# Patient Record
Sex: Female | Born: 1975 | Race: White | Hispanic: No | Marital: Single | Smoking: Current every day smoker
Health system: Southern US, Community
[De-identification: ages and names within clinical notes are randomized; demographics above are authoritative.]

## PROBLEM LIST (undated history)

## (undated) DIAGNOSIS — G932 Benign intracranial hypertension: Secondary | ICD-10-CM

## (undated) DIAGNOSIS — E079 Disorder of thyroid, unspecified: Secondary | ICD-10-CM

## (undated) DIAGNOSIS — F419 Anxiety disorder, unspecified: Secondary | ICD-10-CM

## (undated) HISTORY — PX: WISDOM TOOTH EXTRACTION: SHX21

## (undated) HISTORY — PX: VENTRICULAR ATRIAL SHUNT: SHX2657

## (undated) HISTORY — PX: CHOLECYSTECTOMY: SHX55

---

## 2005-10-02 ENCOUNTER — Emergency Department: Payer: Self-pay | Admitting: Emergency Medicine

## 2006-06-15 ENCOUNTER — Emergency Department: Payer: Self-pay | Admitting: Emergency Medicine

## 2006-08-12 ENCOUNTER — Emergency Department: Payer: Self-pay | Admitting: Emergency Medicine

## 2006-12-17 IMAGING — CR DG LUMBAR SPINE 2-3V
1 series · 3 of 3 positions shown · non-contrast
Comparison: none

REASON FOR EXAM: Mid back pain
COMMENTS:

[Series 1: view not recorded · 0.17mm/px · 3 of 3 slices shown]
[im 1/3]
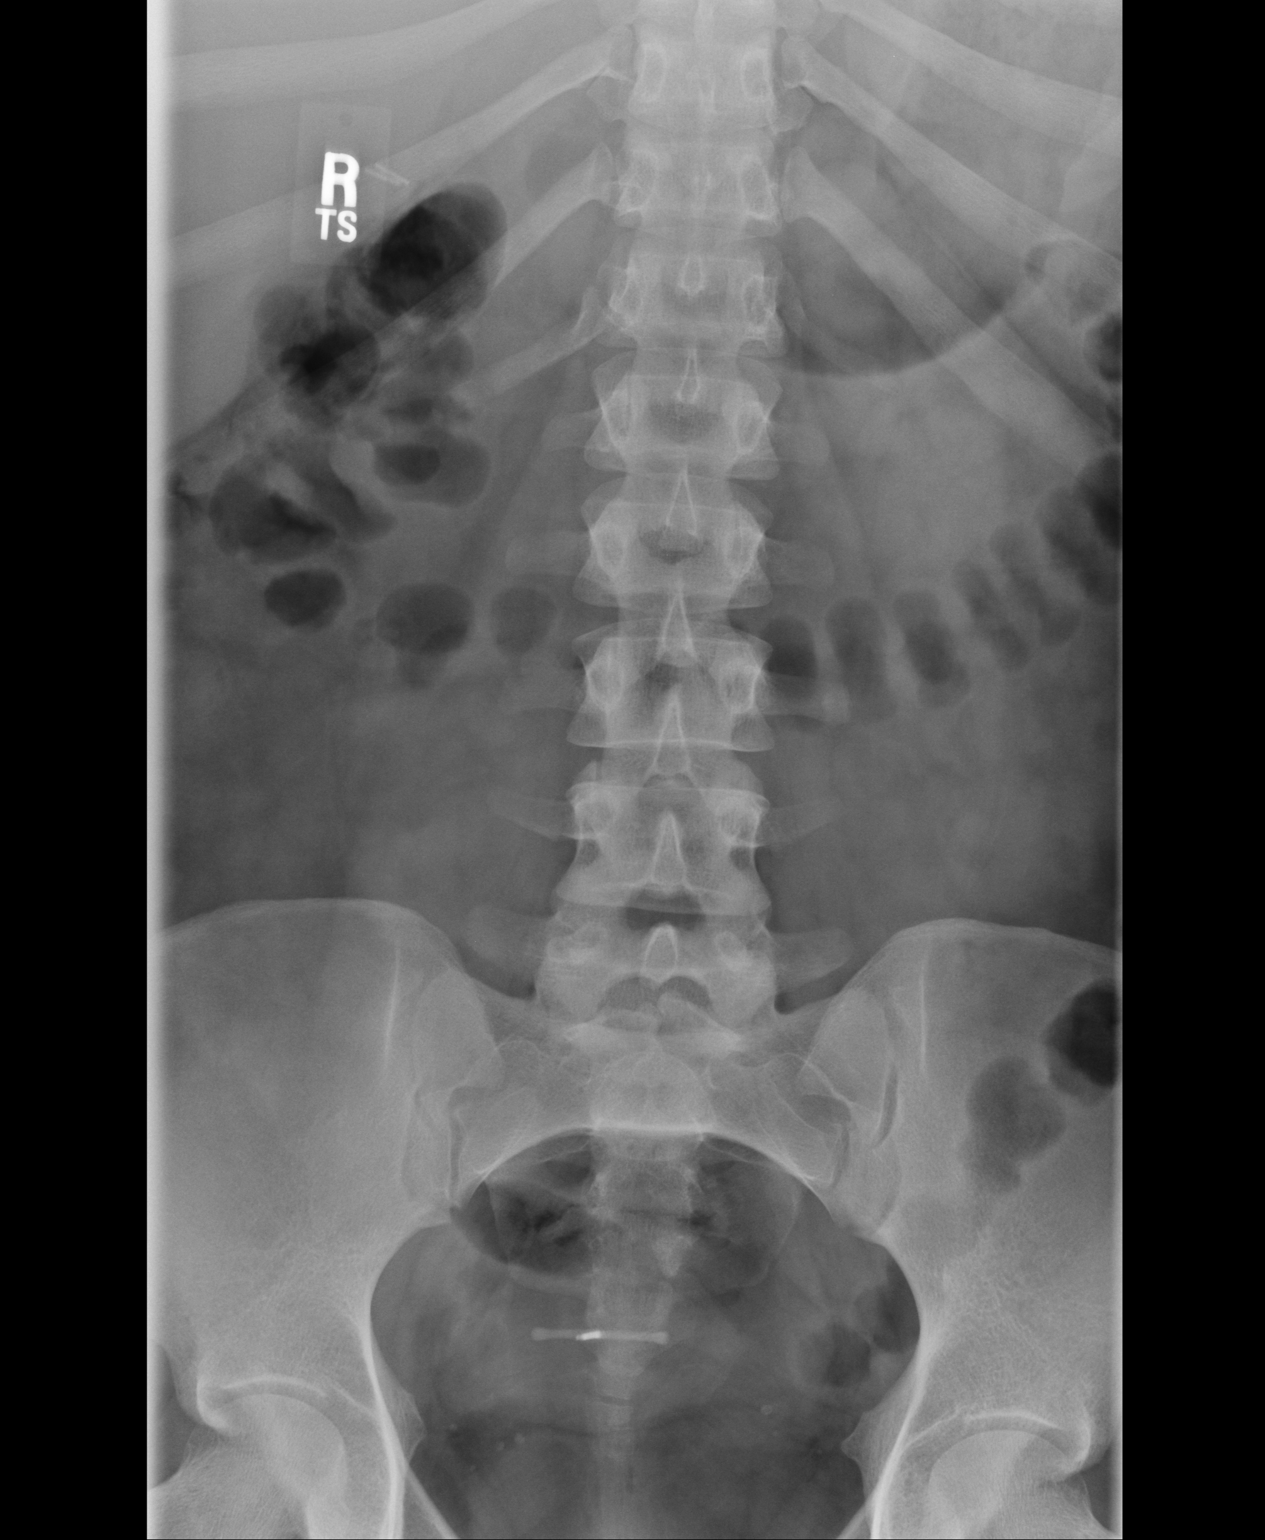
[im 2/3]
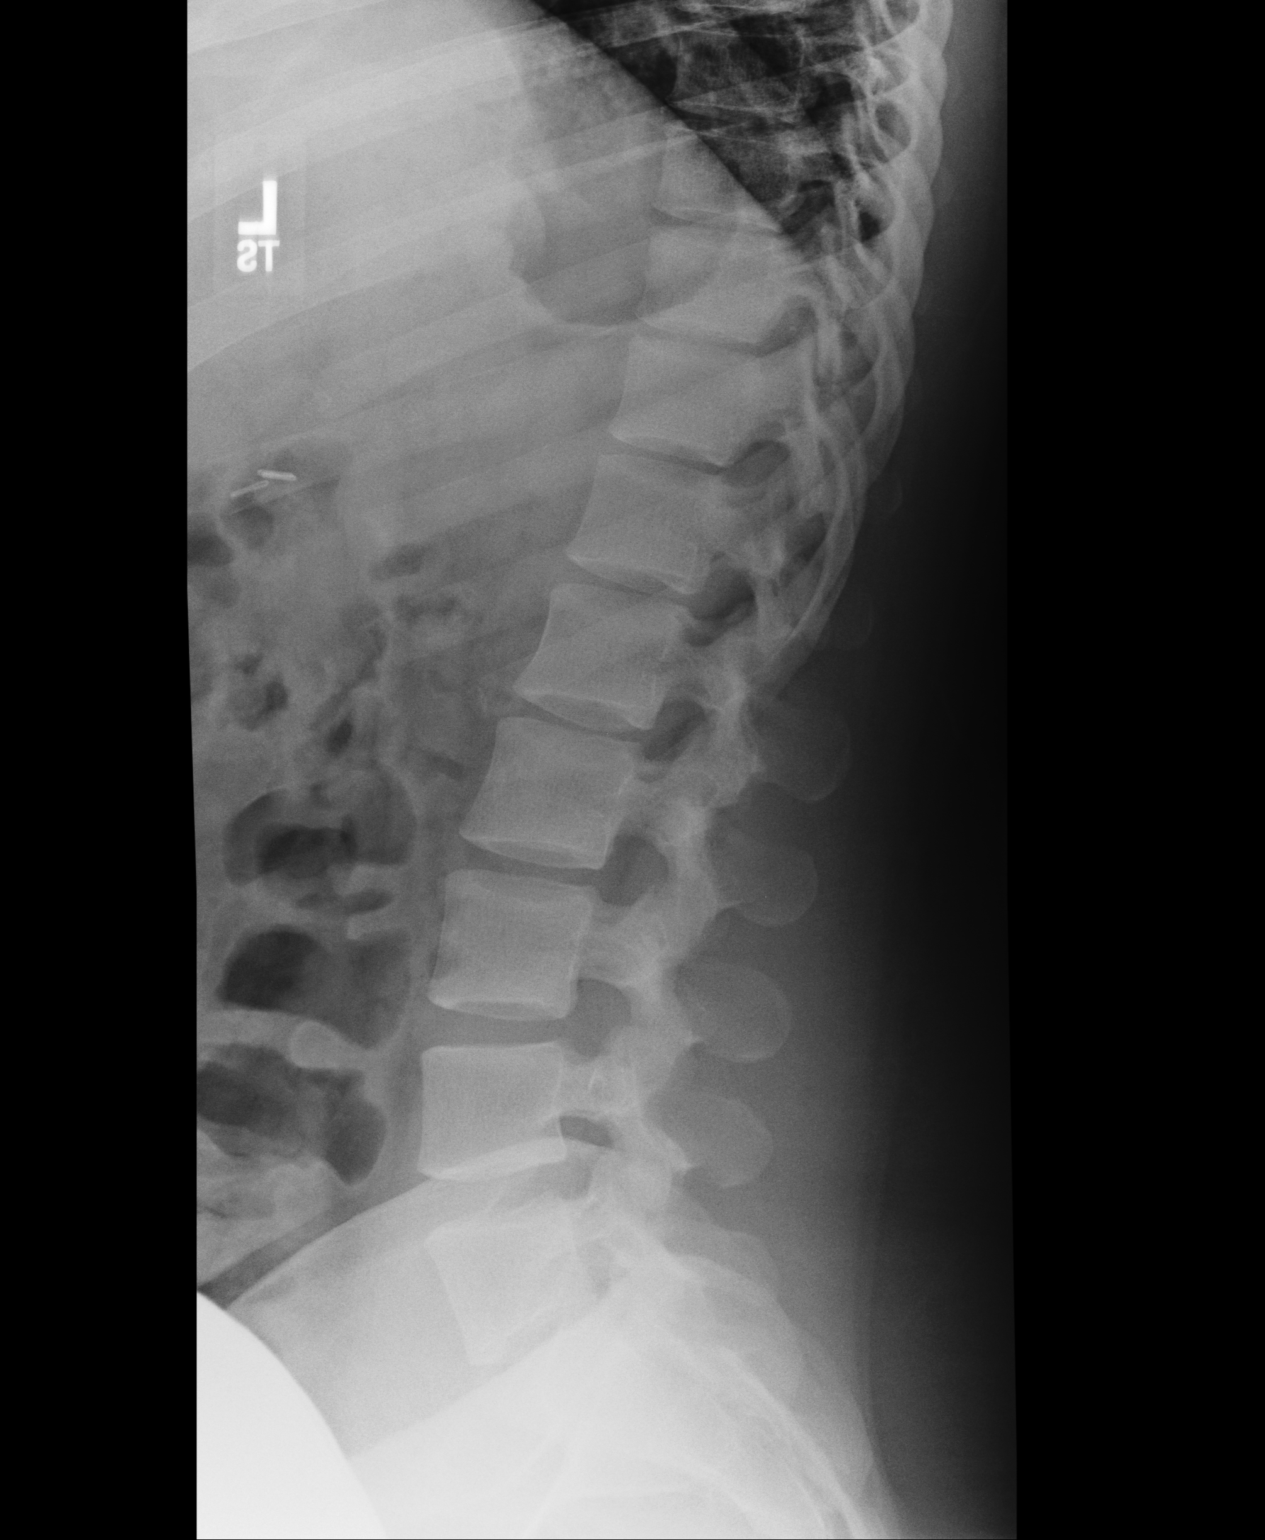
[im 3/3]
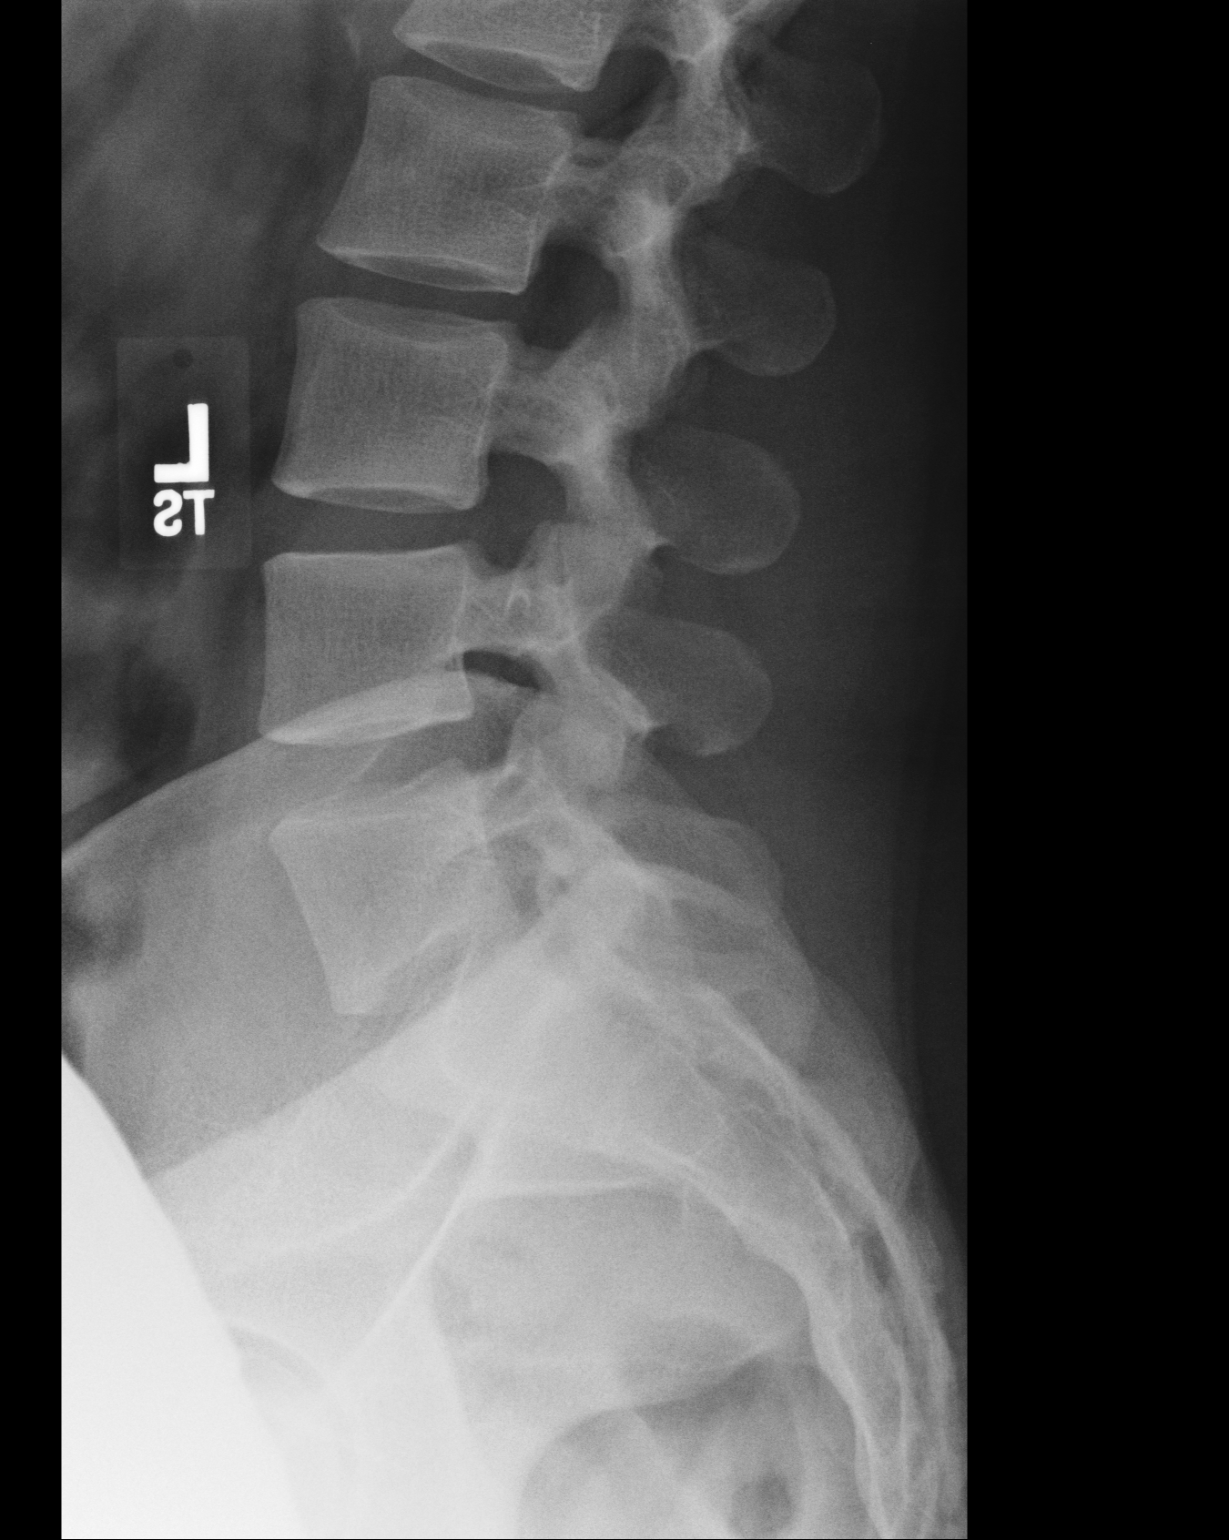

[3 of 3 positions shown; findings below may reference images not displayed]

PROCEDURE:     DXR - DXR LUMBAR SPINE AP AND LATERAL  - June 15, 2006  [DATE]

RESULT:       AP and lateral views of the lumbar spine show the vertebral
body heights and intervertebral disc spaces to be well maintained.
Vertebral body alignment is normal.  The pedicles are bilaterally intact.
Incidental note is made of a IUD in the pelvic area.
IMPRESSION: No acute changes are identified.

## 2007-08-22 ENCOUNTER — Emergency Department: Payer: Self-pay | Admitting: Emergency Medicine

## 2007-09-05 ENCOUNTER — Emergency Department: Payer: Self-pay | Admitting: Emergency Medicine

## 2007-11-28 ENCOUNTER — Emergency Department: Payer: Self-pay | Admitting: Emergency Medicine

## 2008-03-09 ENCOUNTER — Other Ambulatory Visit: Payer: Self-pay

## 2008-03-09 ENCOUNTER — Emergency Department: Payer: Self-pay | Admitting: Emergency Medicine

## 2008-05-31 IMAGING — CR DG CHEST 2V
1 series · 2 of 2 positions shown · non-contrast
Comparison: none

REASON FOR EXAM: Pain, productive cough
COMMENTS:

[Series 1: view not recorded · 0.17mm/px · 2 of 2 slices shown]
[im 1/2]
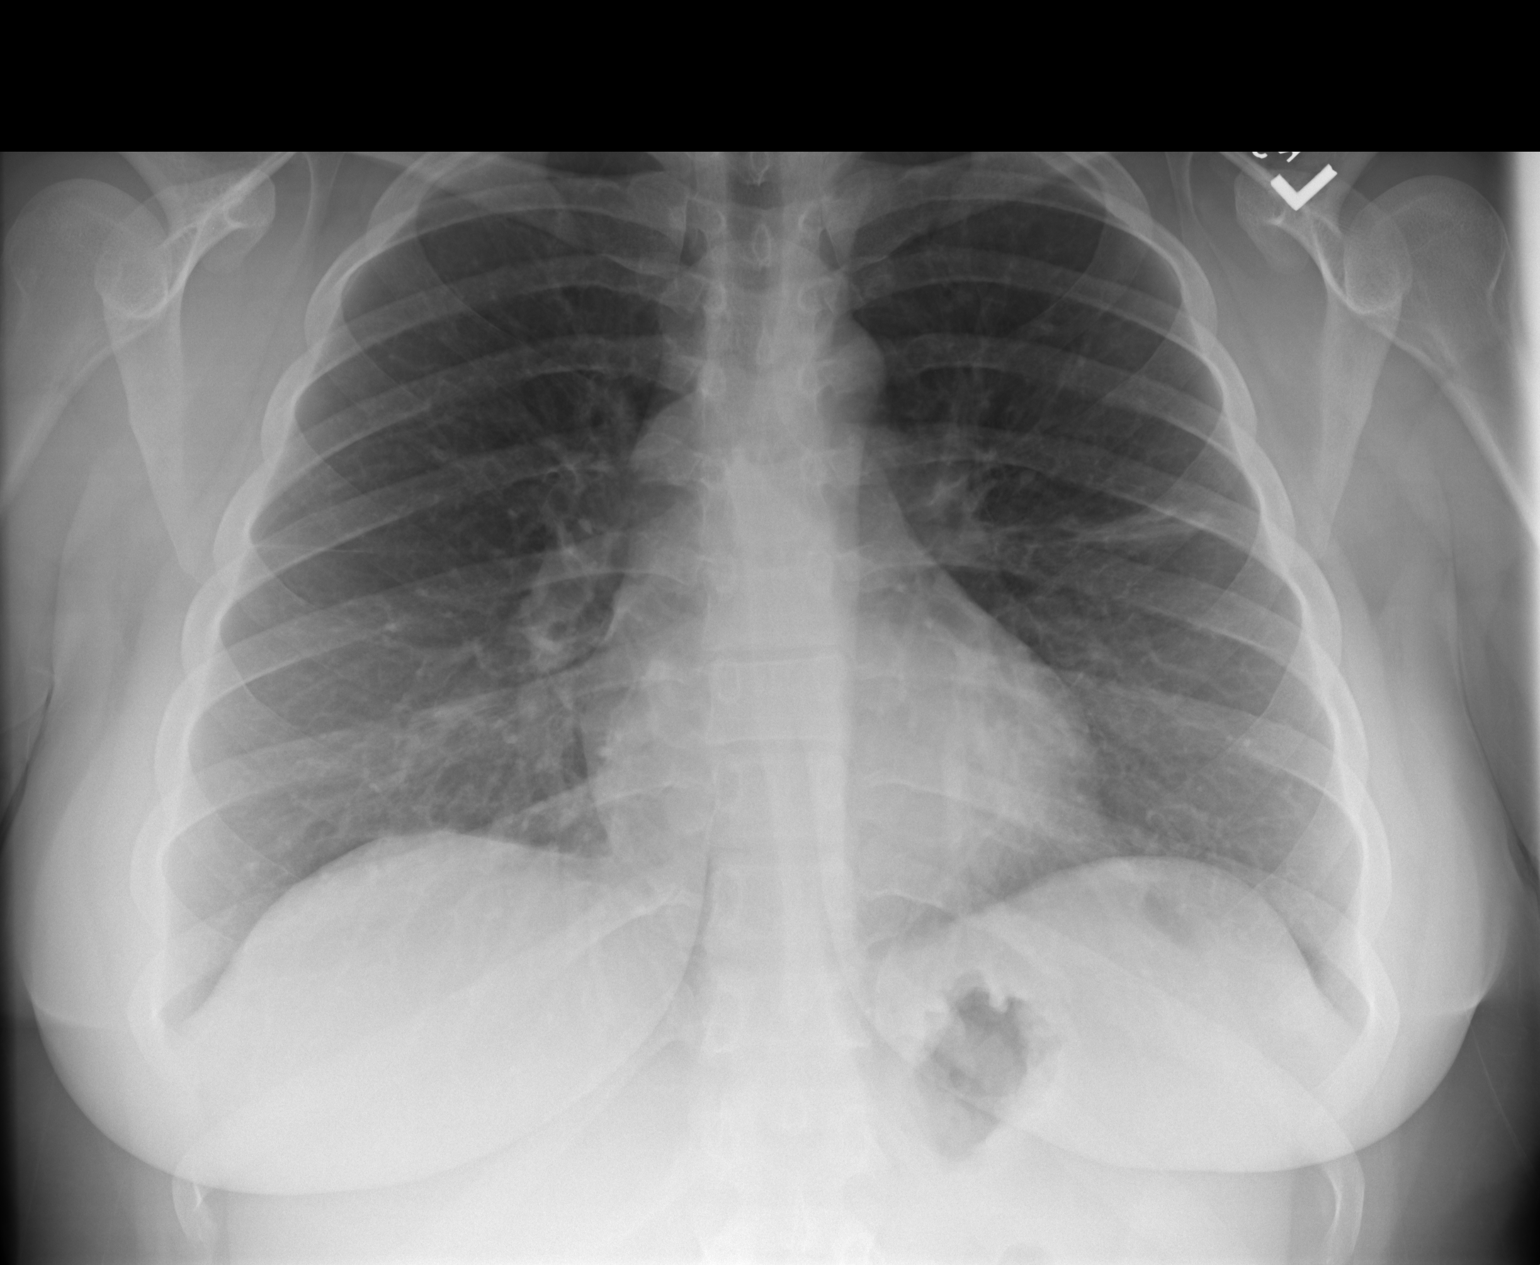
[im 2/2]
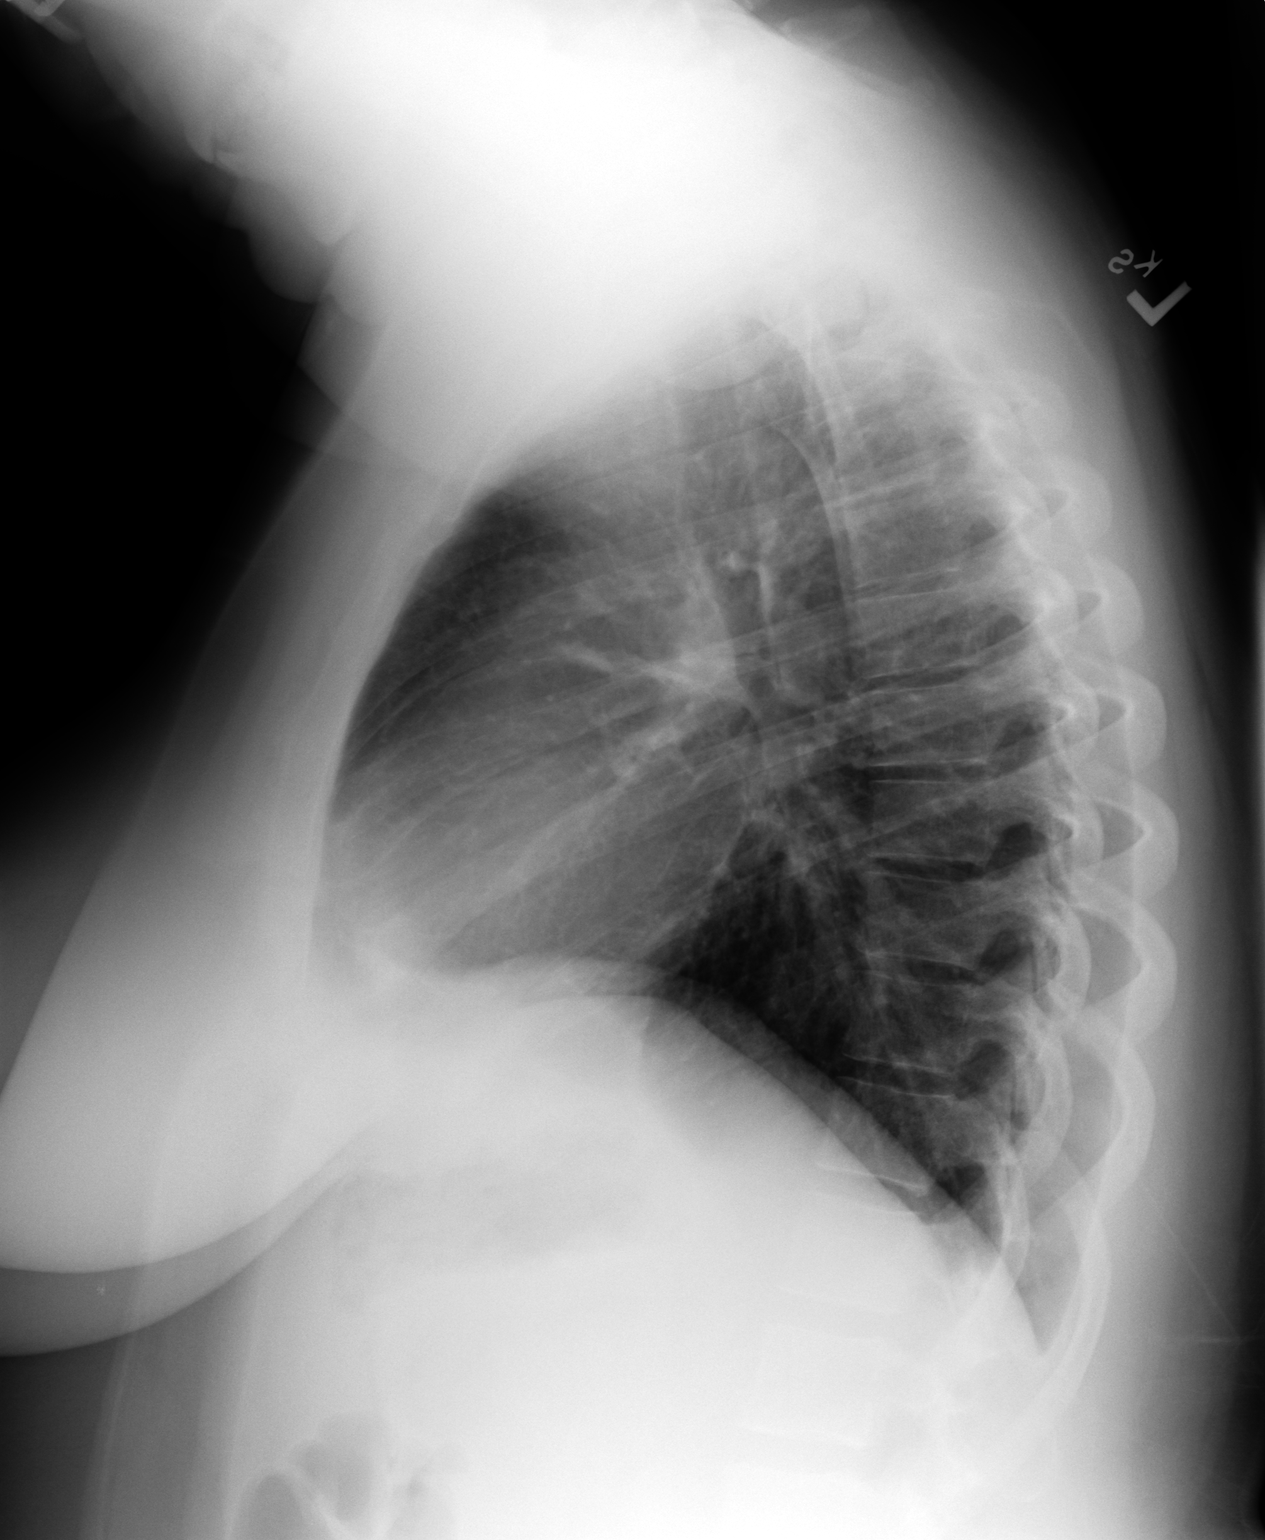

[2 of 2 positions shown; findings below may reference images not displayed]

PROCEDURE:     DXR - DXR CHEST PA (OR AP) AND LATERAL  - November 28, 2007 [DATE]

RESULT:     There is increased density in the LEFT mid lung suggestive of
plate-like atelectasis or early infiltrate. The lungs are otherwise clear.
The heart and pulmonary vessels are normal. The bony and mediastinal
structures are unremarkable.
IMPRESSION: LEFT mid lung atelectasis or developing infiltrate.

## 2008-08-13 ENCOUNTER — Emergency Department: Payer: Self-pay | Admitting: Emergency Medicine

## 2008-09-10 IMAGING — CR DG CHEST 2V
1 series · 2 of 2 positions shown · non-contrast
Comparison: none

REASON FOR EXAM: Chest pain
COMMENTS:

PROCEDURE:     DXR - DXR CHEST PA (OR AP) AND LATERAL  - March 09, 2008  [DATE]
RESULT:     Comparison is made to the prior exam of 11/28/2007.
The lung fields are clear. The heart, mediastinal and osseous structures
show no significant abnormalities.

[Series 1: view not recorded · 0.17mm/px · 2 of 2 slices shown]
[im 1/2]
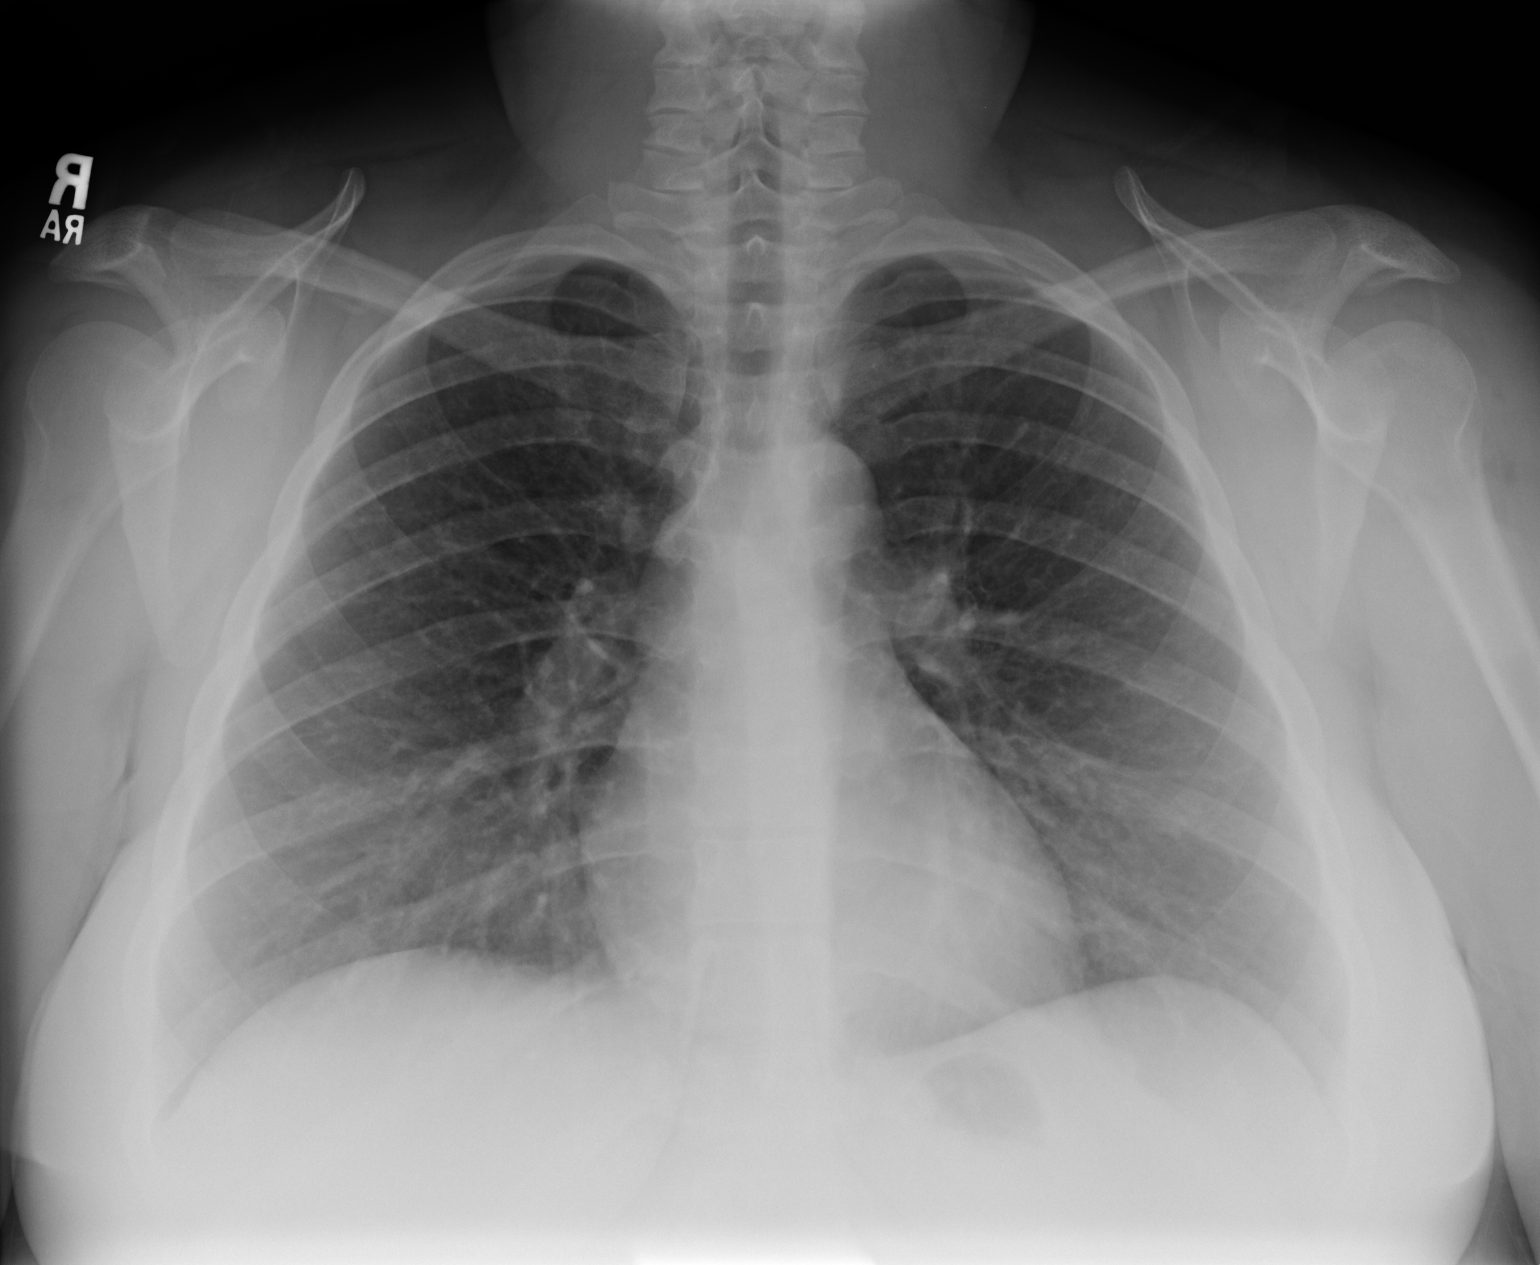
[im 2/2]
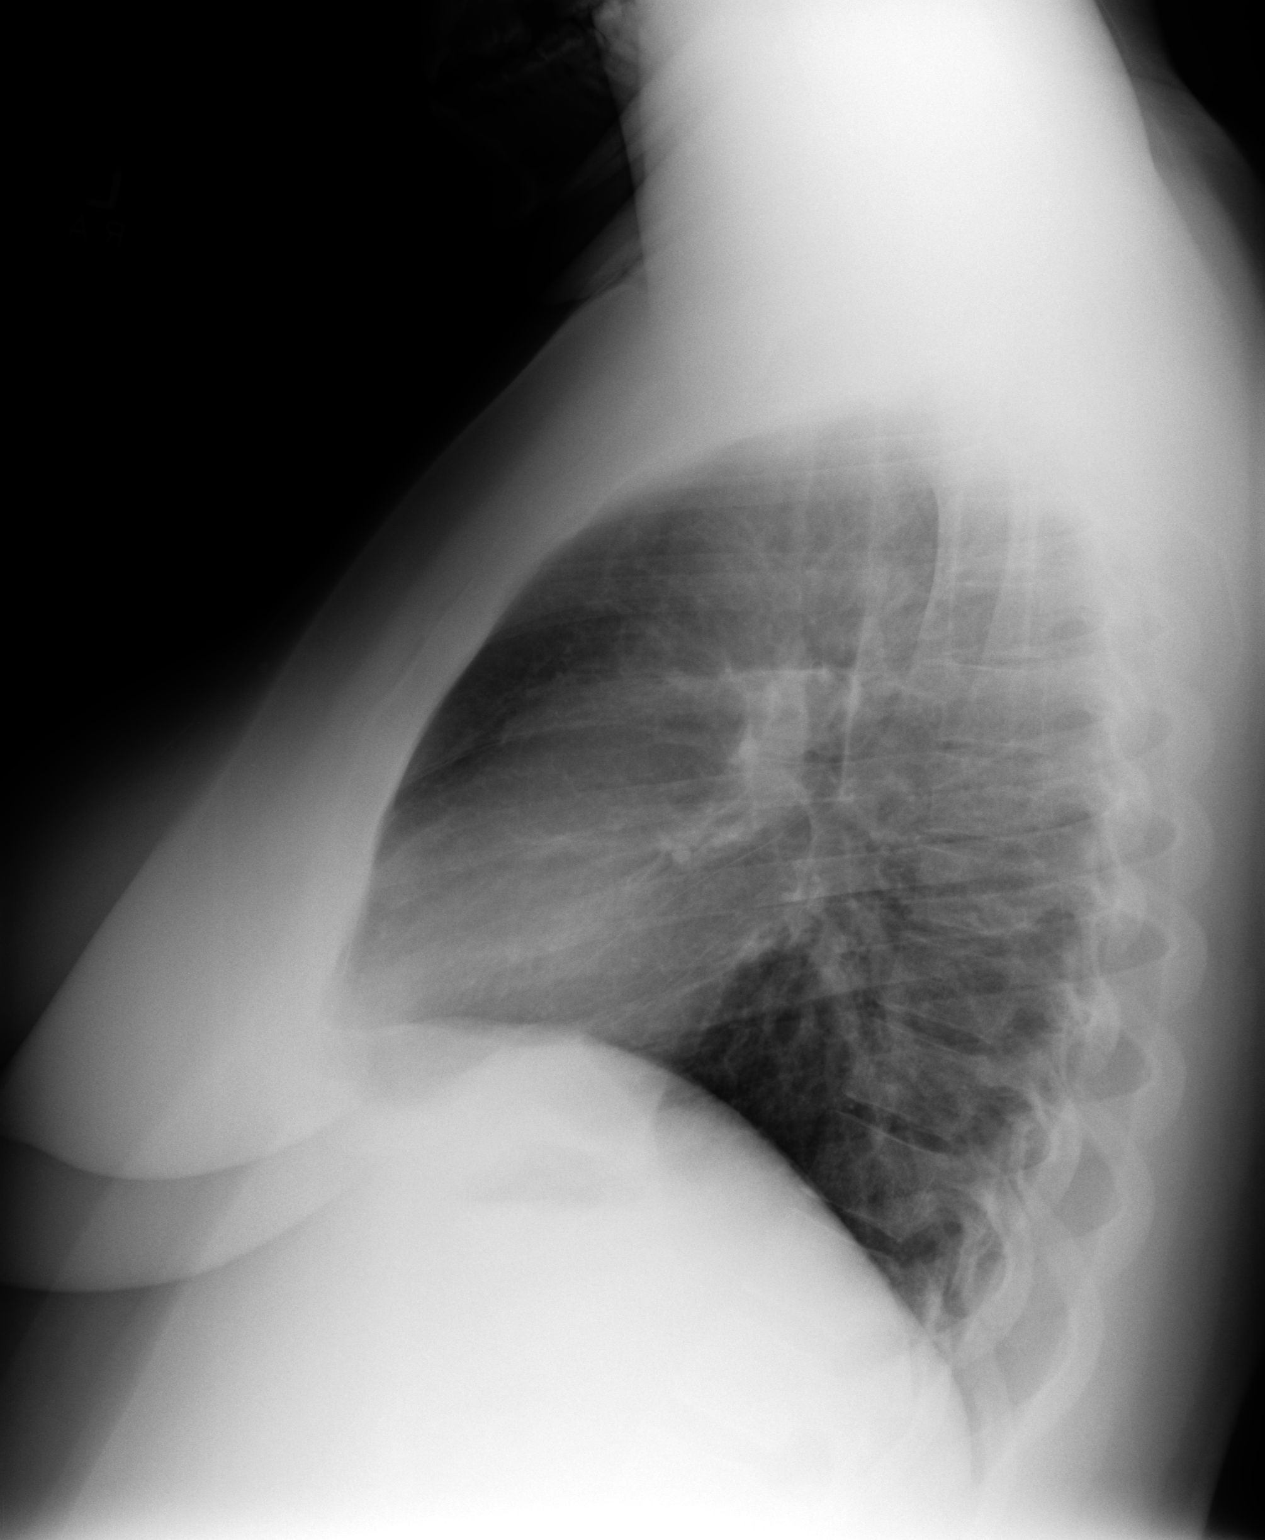

[2 of 2 positions shown; findings below may reference images not displayed]

IMPRESSION: No acute changes are identified.

## 2008-10-26 ENCOUNTER — Ambulatory Visit: Payer: Self-pay | Admitting: Internal Medicine

## 2009-01-24 ENCOUNTER — Emergency Department: Payer: Self-pay

## 2009-02-14 IMAGING — CR DG FOOT COMPLETE 3+V*L*
1 series · 3 of 3 positions shown · non-contrast
Comparison: none

REASON FOR EXAM: pain
COMMENTS:

PROCEDURE:     DXR - DXR FOOT LT COMP W/OBLIQUES  - August 13, 2008 [DATE]
RESULT:     No acute bony or joint abnormalities are identified. There is no
evidence of fracture.

[Series 1: view not recorded · 0.17mm/px · 3 of 3 slices shown]
[im 1/3]
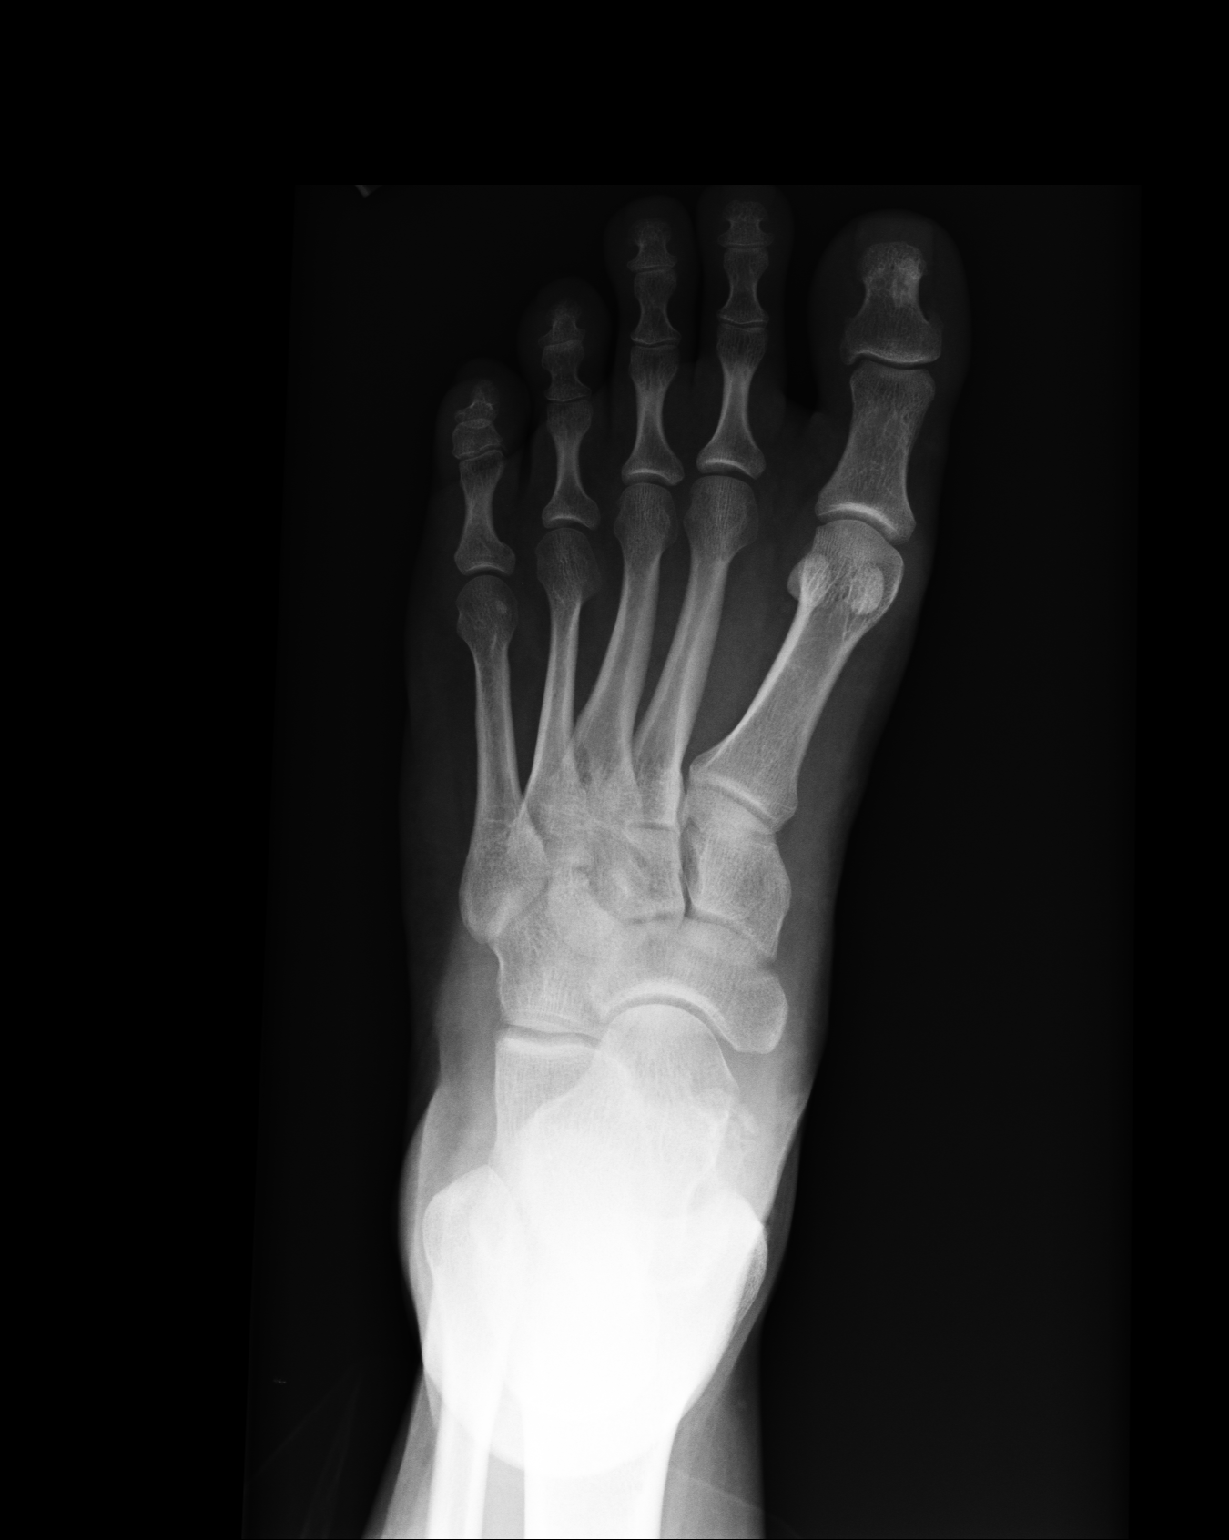
[im 2/3]
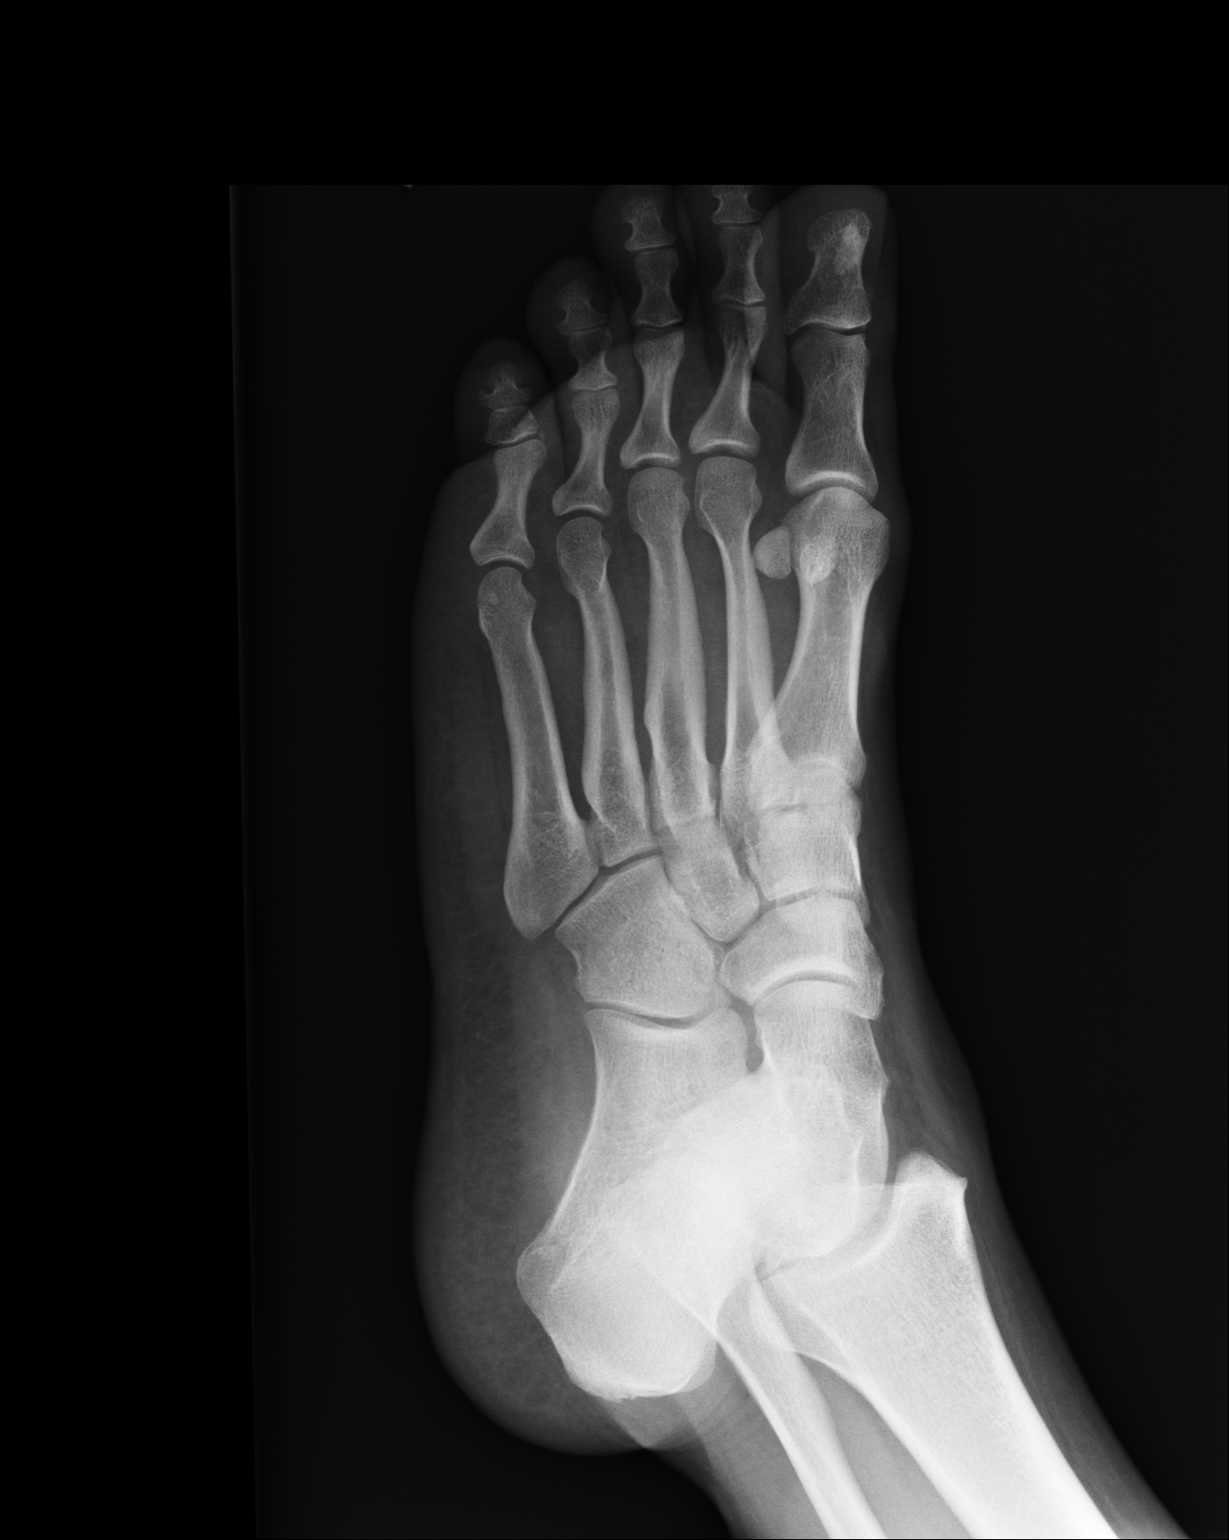
[im 3/3]
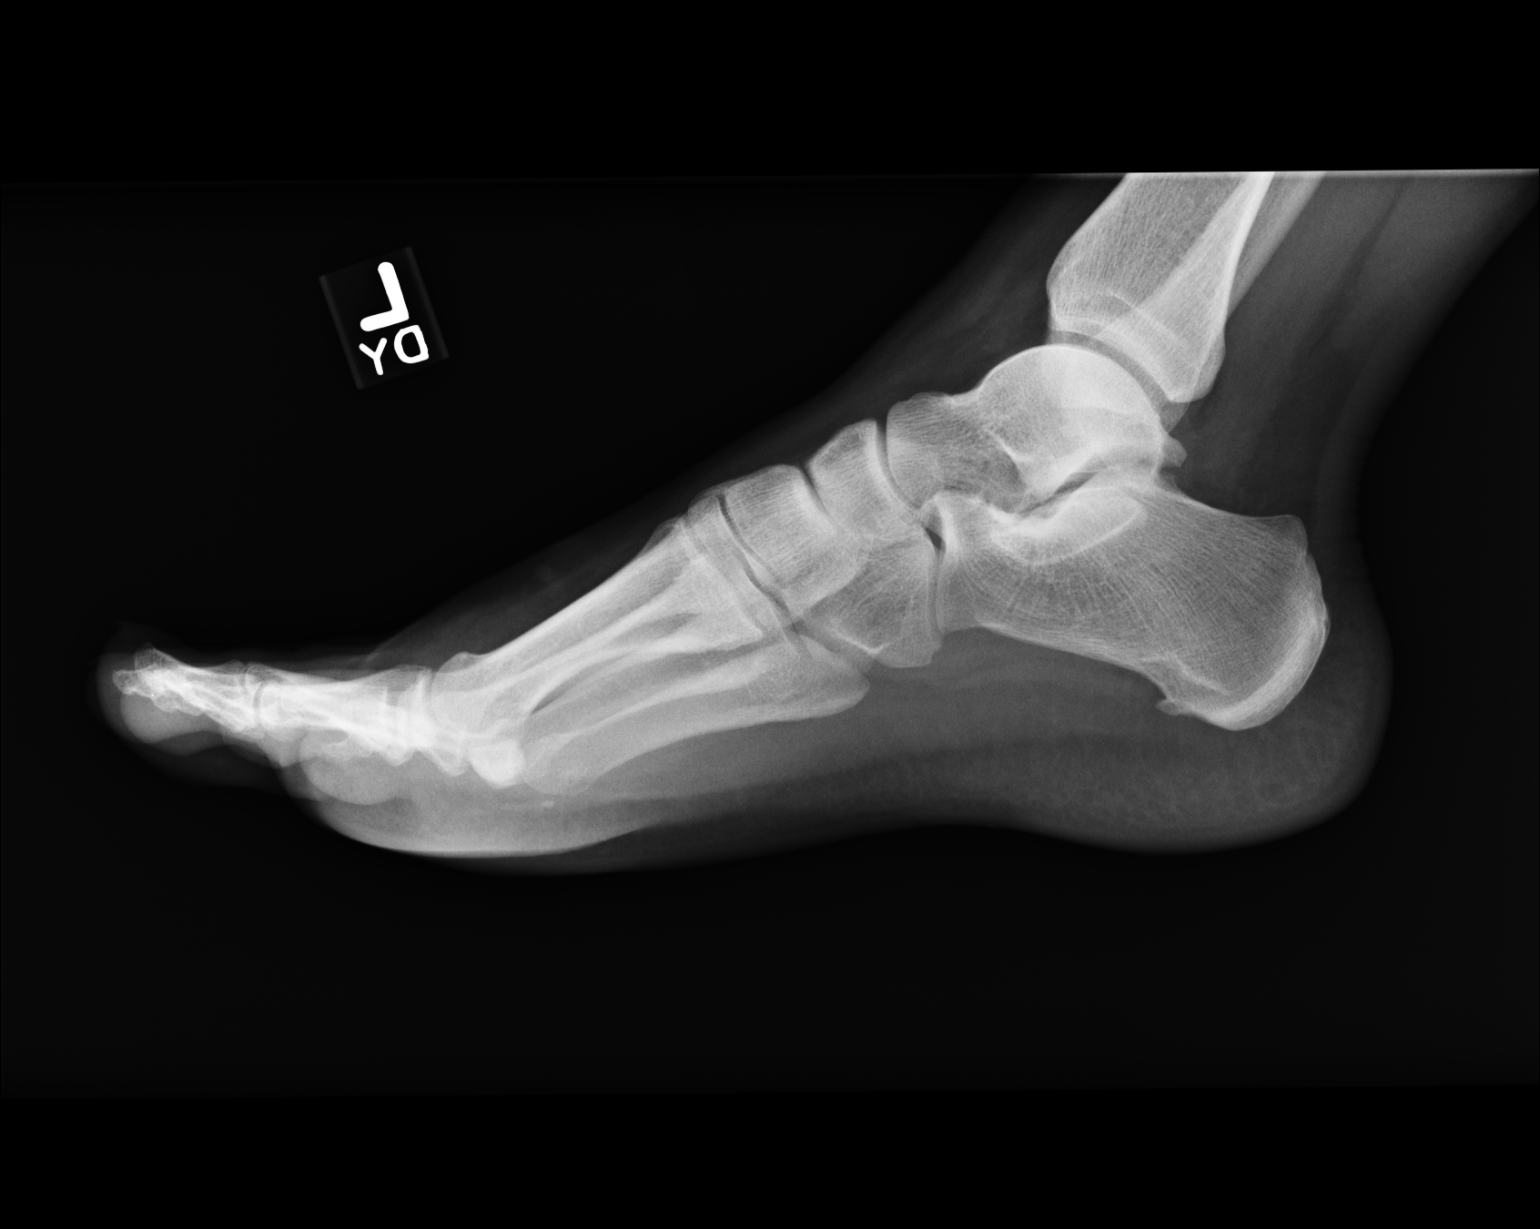

[3 of 3 positions shown; findings below may reference images not displayed]

IMPRESSION: 1. No acute bony or joint abnormalities are identified.

## 2011-08-24 ENCOUNTER — Emergency Department: Payer: Self-pay | Admitting: *Deleted

## 2011-08-27 ENCOUNTER — Inpatient Hospital Stay: Payer: Self-pay | Admitting: Psychiatry

## 2011-11-09 ENCOUNTER — Emergency Department: Payer: Self-pay | Admitting: *Deleted

## 2012-06-28 ENCOUNTER — Inpatient Hospital Stay: Payer: Self-pay | Admitting: Psychiatry

## 2012-06-28 LAB — DRUG SCREEN, URINE
Amphetamines, Ur Screen: NEGATIVE (ref ?–1000)
Benzodiazepine, Ur Scrn: NEGATIVE (ref ?–200)
Cannabinoid 50 Ng, Ur ~~LOC~~: POSITIVE (ref ?–50)
Cocaine Metabolite,Ur ~~LOC~~: POSITIVE (ref ?–300)
MDMA (Ecstasy)Ur Screen: NEGATIVE (ref ?–500)
Opiate, Ur Screen: NEGATIVE (ref ?–300)
Phencyclidine (PCP) Ur S: NEGATIVE (ref ?–25)

## 2012-06-28 LAB — URINALYSIS, COMPLETE
Bilirubin,UR: NEGATIVE
Glucose,UR: NEGATIVE mg/dL (ref 0–75)
Ph: 5 (ref 4.5–8.0)
Specific Gravity: 1.023 (ref 1.003–1.030)
Squamous Epithelial: 12

## 2012-06-28 LAB — CBC
HCT: 40 % (ref 35.0–47.0)
HGB: 13.9 g/dL (ref 12.0–16.0)
MCH: 32.3 pg (ref 26.0–34.0)
MCHC: 34.9 g/dL (ref 32.0–36.0)
RDW: 12.4 % (ref 11.5–14.5)

## 2012-06-28 LAB — COMPREHENSIVE METABOLIC PANEL
Albumin: 3.7 g/dL (ref 3.4–5.0)
Alkaline Phosphatase: 87 U/L (ref 50–136)
BUN: 5 mg/dL — ABNORMAL LOW (ref 7–18)
Bilirubin,Total: 0.8 mg/dL (ref 0.2–1.0)
Chloride: 110 mmol/L — ABNORMAL HIGH (ref 98–107)
Creatinine: 1.08 mg/dL (ref 0.60–1.30)
EGFR (African American): >60
Glucose: 93 mg/dL (ref 65–99)
Osmolality: 282 (ref 275–301)
Potassium: 3.3 mmol/L — ABNORMAL LOW (ref 3.5–5.1)
SGOT(AST): 20 U/L (ref 15–37)
Sodium: 143 mmol/L (ref 136–145)

## 2012-06-28 LAB — ETHANOL: Ethanol %: <0.003 % (ref 0.000–0.080)

## 2012-06-28 LAB — PREGNANCY, URINE: Pregnancy Test, Urine: NEGATIVE m[IU]/mL

## 2012-07-01 LAB — HEPATIC FUNCTION PANEL A (ARMC)
Albumin: 3.7 g/dL (ref 3.4–5.0)
Alkaline Phosphatase: 82 U/L (ref 50–136)
Bilirubin, Direct: 0.2 mg/dL (ref 0.00–0.20)
Bilirubin,Total: 0.7 mg/dL (ref 0.2–1.0)
Total Protein: 7 g/dL (ref 6.4–8.2)

## 2012-07-01 LAB — VALPROIC ACID LEVEL: Valproic Acid: 68 ug/mL

## 2012-12-30 IMAGING — CR RIGHT HAND - COMPLETE 3+ VIEW
1 series · 3 of 3 positions shown · non-contrast
Comparison: none

REASON FOR EXAM: bruising and pain eval fx
COMMENTS:

PROCEDURE:     DXR - DXR HAND RT COMPLETE W/OBLIQUES  - June 28, 2012  [DATE]
RESULT:     Right hand images show a tiny avulsion from the tip of the ulnar
styloid process. The bony structures otherwise appear intact. No dislocation
is evident.

[Series 1: x hand pa right · 0.14mm/px · 3 of 3 slices shown]
[im 1/3]
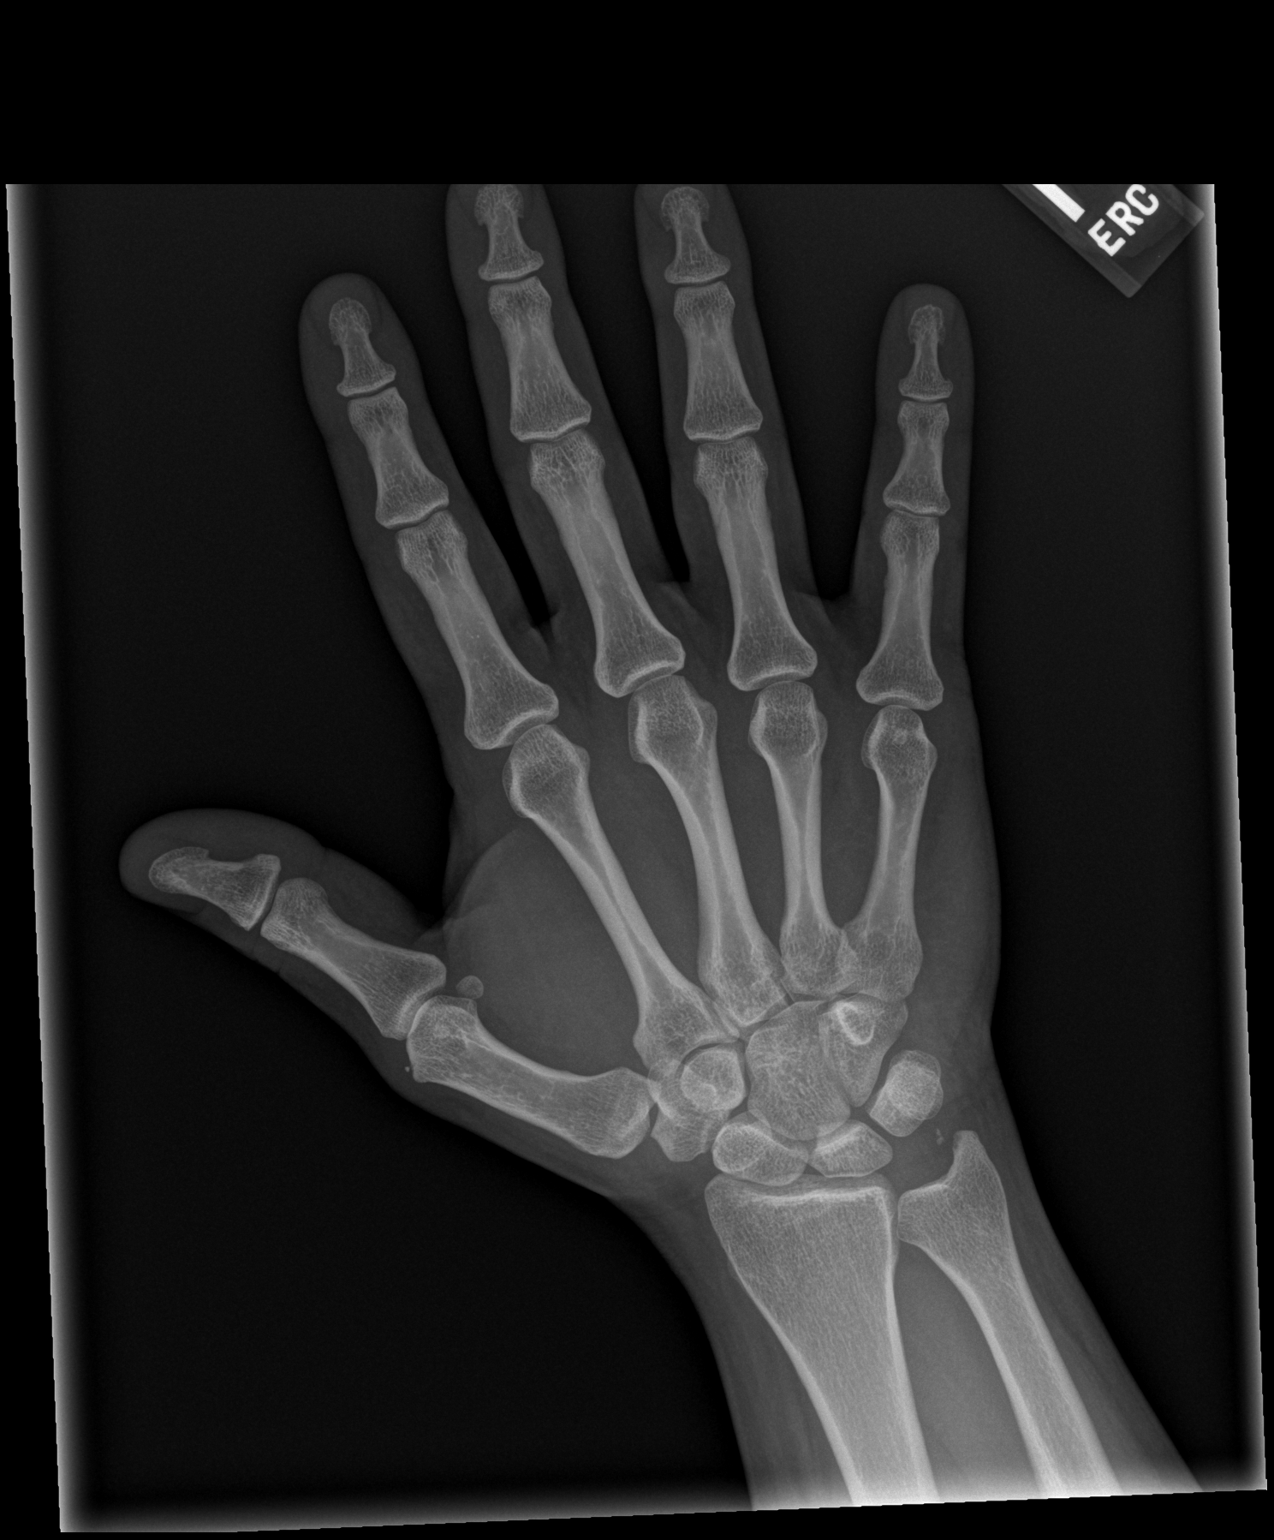
[im 2/3]
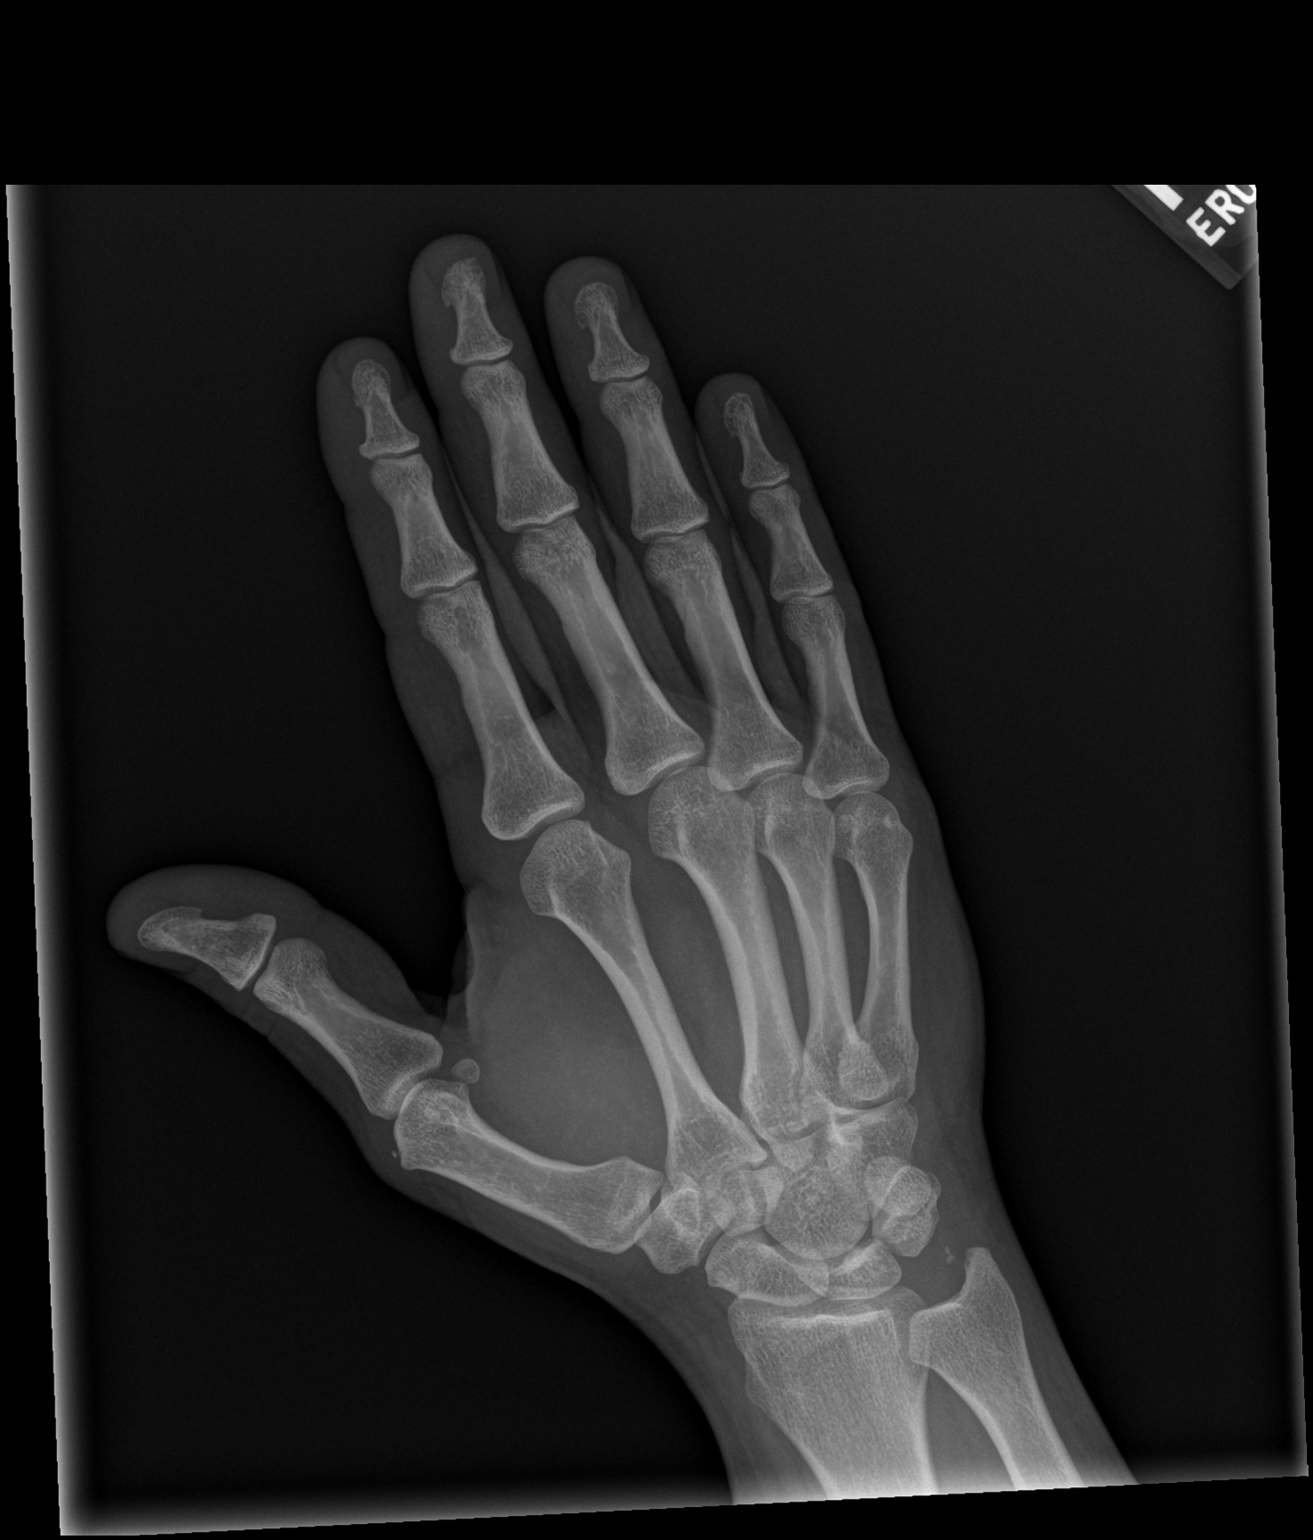
[im 3/3]
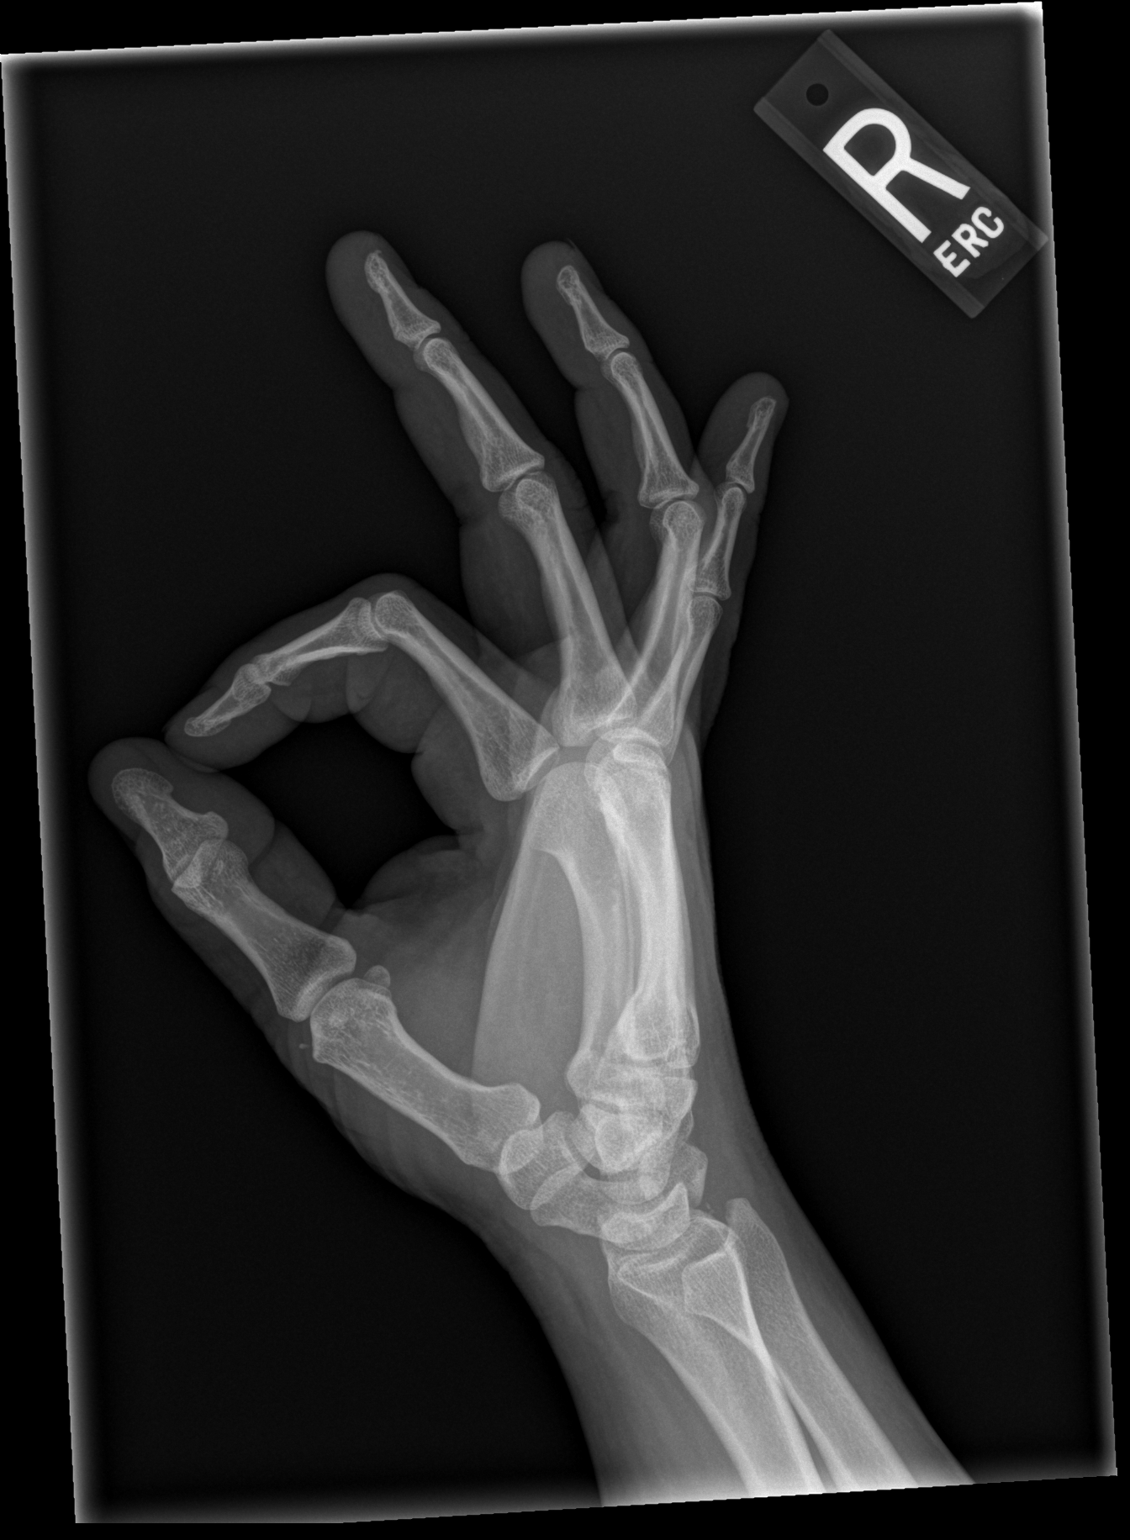

[3 of 3 positions shown; findings below may reference images not displayed]

IMPRESSION: Avulsed fragments3 near the tip of the ulnar styloid
process of uncertain chronicity. Correlate clinically.

[REDACTED]

## 2014-01-30 ENCOUNTER — Emergency Department: Payer: Self-pay | Admitting: Emergency Medicine

## 2014-01-30 LAB — URINALYSIS, COMPLETE
BACTERIA: NONE SEEN
Bilirubin,UR: NEGATIVE
Blood: NEGATIVE
Glucose,UR: NEGATIVE mg/dL (ref 0–75)
KETONE: NEGATIVE
NITRITE: NEGATIVE
PH: 6 (ref 4.5–8.0)
PROTEIN: NEGATIVE
RBC,UR: 2 /HPF (ref 0–5)
Specific Gravity: 1.016 (ref 1.003–1.030)
Squamous Epithelial: 16

## 2014-01-30 LAB — CBC WITH DIFFERENTIAL/PLATELET
BASOS ABS: 0.1 10*3/uL (ref 0.0–0.1)
BASOS PCT: 0.6 %
Eosinophil #: 0.2 10*3/uL (ref 0.0–0.7)
Eosinophil %: 1.7 %
HCT: 36.7 % (ref 35.0–47.0)
HGB: 12.6 g/dL (ref 12.0–16.0)
LYMPHS PCT: 21.6 %
Lymphocyte #: 2.4 10*3/uL (ref 1.0–3.6)
MCH: 31.5 pg (ref 26.0–34.0)
MCHC: 34.3 g/dL (ref 32.0–36.0)
MCV: 92 fL (ref 80–100)
Monocyte #: 0.7 x10 3/mm (ref 0.2–0.9)
Monocyte %: 6.2 %
Neutrophil #: 7.6 10*3/uL — ABNORMAL HIGH (ref 1.4–6.5)
Neutrophil %: 69.9 %
Platelet: 207 10*3/uL (ref 150–440)
RBC: 3.99 10*6/uL (ref 3.80–5.20)
RDW: 13.3 % (ref 11.5–14.5)
WBC: 10.9 10*3/uL (ref 3.6–11.0)

## 2014-01-30 LAB — COMPREHENSIVE METABOLIC PANEL
ALK PHOS: 82 U/L
AST: 12 U/L — AB (ref 15–37)
Albumin: 3.1 g/dL — ABNORMAL LOW (ref 3.4–5.0)
Anion Gap: 7 (ref 7–16)
BUN: 6 mg/dL — AB (ref 7–18)
Bilirubin,Total: 0.6 mg/dL (ref 0.2–1.0)
CHLORIDE: 107 mmol/L (ref 98–107)
Calcium, Total: 8.4 mg/dL — ABNORMAL LOW (ref 8.5–10.1)
Co2: 22 mmol/L (ref 21–32)
Creatinine: 0.7 mg/dL (ref 0.60–1.30)
EGFR (Non-African Amer.): >60
GLUCOSE: 91 mg/dL (ref 65–99)
Osmolality: 269 (ref 275–301)
Potassium: 3.7 mmol/L (ref 3.5–5.1)
SGPT (ALT): 10 U/L — ABNORMAL LOW (ref 12–78)
Sodium: 136 mmol/L (ref 136–145)
TOTAL PROTEIN: 6.4 g/dL (ref 6.4–8.2)

## 2014-01-30 LAB — HCG, QUANTITATIVE, PREGNANCY: Beta Hcg, Quant.: 28436 m[IU]/mL — ABNORMAL HIGH

## 2014-03-14 ENCOUNTER — Emergency Department: Payer: Self-pay | Admitting: Emergency Medicine

## 2014-04-05 ENCOUNTER — Observation Stay: Payer: Self-pay

## 2014-04-05 LAB — DRUG SCREEN, URINE
AMPHETAMINES, UR SCREEN: NEGATIVE (ref ?–1000)
Barbiturates, Ur Screen: NEGATIVE (ref ?–200)
Benzodiazepine, Ur Scrn: NEGATIVE (ref ?–200)
CANNABINOID 50 NG, UR ~~LOC~~: NEGATIVE (ref ?–50)
Cocaine Metabolite,Ur ~~LOC~~: NEGATIVE (ref ?–300)
MDMA (ECSTASY) UR SCREEN: NEGATIVE (ref ?–500)
Methadone, Ur Screen: NEGATIVE (ref ?–300)
OPIATE, UR SCREEN: NEGATIVE (ref ?–300)
PHENCYCLIDINE (PCP) UR S: NEGATIVE (ref ?–25)
Tricyclic, Ur Screen: NEGATIVE (ref ?–1000)

## 2014-04-05 LAB — URINALYSIS, COMPLETE
Bilirubin,UR: NEGATIVE
Blood: NEGATIVE
Glucose,UR: NEGATIVE mg/dL (ref 0–75)
Hyaline Cast: 3
Ketone: NEGATIVE
Nitrite: NEGATIVE
Ph: 6 (ref 4.5–8.0)
Protein: 30
Specific Gravity: 1.02 (ref 1.003–1.030)

## 2014-04-05 LAB — CBC WITH DIFFERENTIAL/PLATELET
Basophil #: 0 10*3/uL (ref 0.0–0.1)
Basophil %: 0.4 %
Eosinophil #: 0.2 10*3/uL (ref 0.0–0.7)
Eosinophil %: 1.8 %
HCT: 34 % — AB (ref 35.0–47.0)
HGB: 11.4 g/dL — AB (ref 12.0–16.0)
Lymphocyte #: 1.4 10*3/uL (ref 1.0–3.6)
Lymphocyte %: 13.1 %
MCH: 32.2 pg (ref 26.0–34.0)
MCHC: 33.5 g/dL (ref 32.0–36.0)
MCV: 96 fL (ref 80–100)
MONOS PCT: 7.8 %
Monocyte #: 0.8 x10 3/mm (ref 0.2–0.9)
Neutrophil #: 8 10*3/uL — ABNORMAL HIGH (ref 1.4–6.5)
Neutrophil %: 76.9 %
Platelet: 171 10*3/uL (ref 150–440)
RBC: 3.53 10*6/uL — ABNORMAL LOW (ref 3.80–5.20)
RDW: 13.6 % (ref 11.5–14.5)
WBC: 10.4 10*3/uL (ref 3.6–11.0)

## 2014-04-05 LAB — GLUCOSE, RANDOM: GLUCOSE: 96 mg/dL (ref 65–99)

## 2014-05-11 ENCOUNTER — Ambulatory Visit: Payer: Self-pay

## 2014-07-06 ENCOUNTER — Observation Stay: Payer: Self-pay

## 2014-09-15 IMAGING — CR DG FOOT COMPLETE 3+V*L*
1 series · 3 of 3 positions shown · non-contrast
Comparison: None.

CLINICAL DATA: Left foot pain laterally

EXAM:
LEFT FOOT - COMPLETE 3+ VIEW

[Series 1: ap · 0.17mm/px · 3 of 3 slices shown]
[im 1/3]
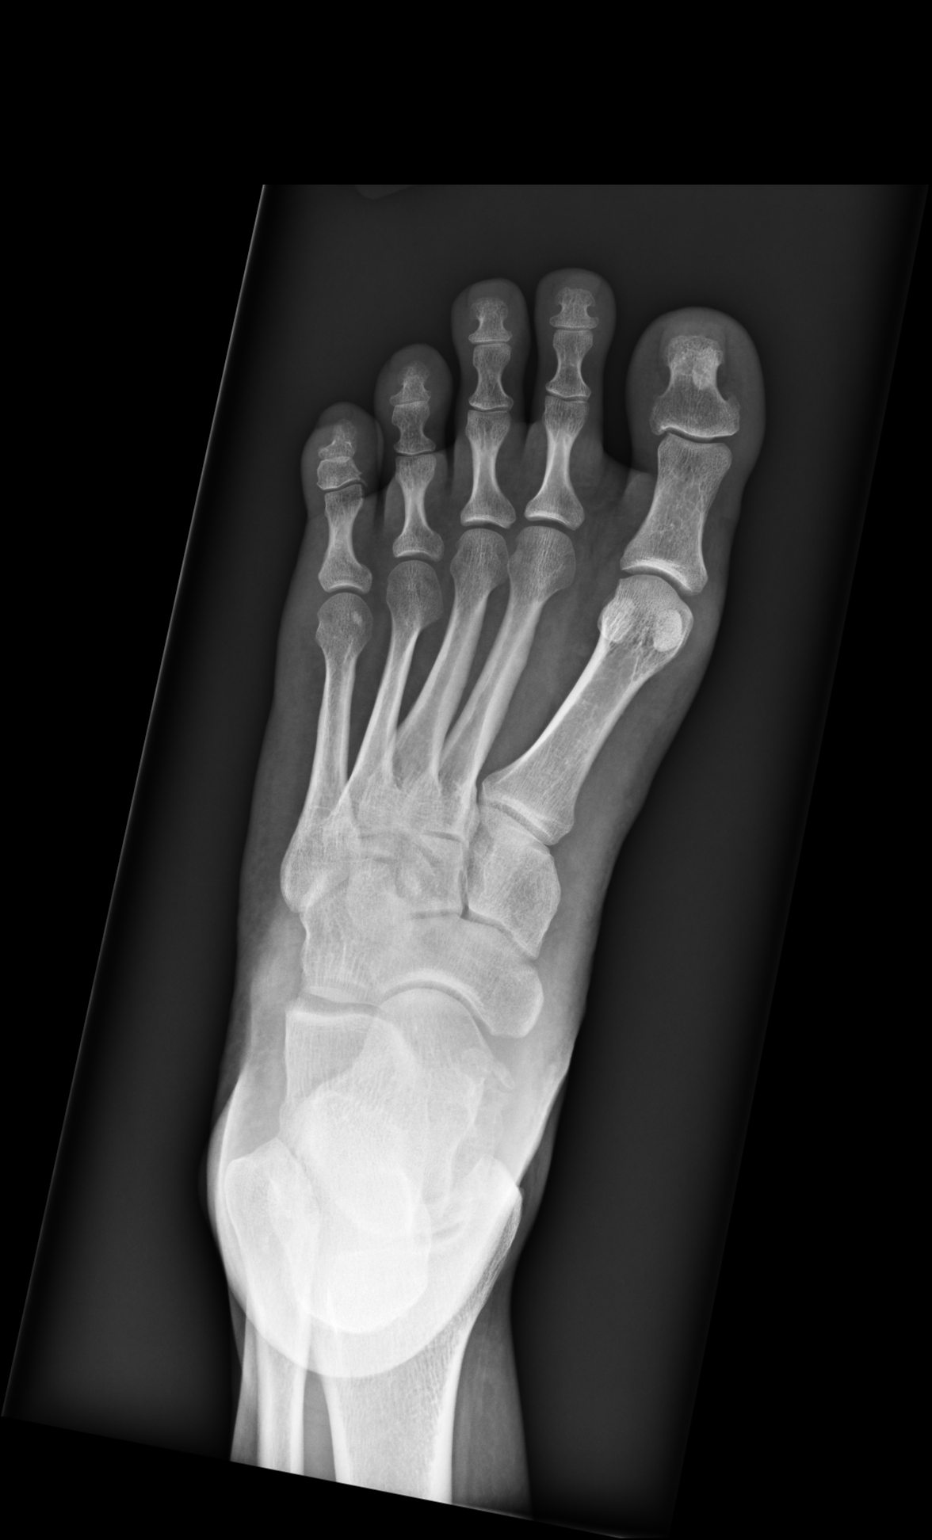
[im 2/3]
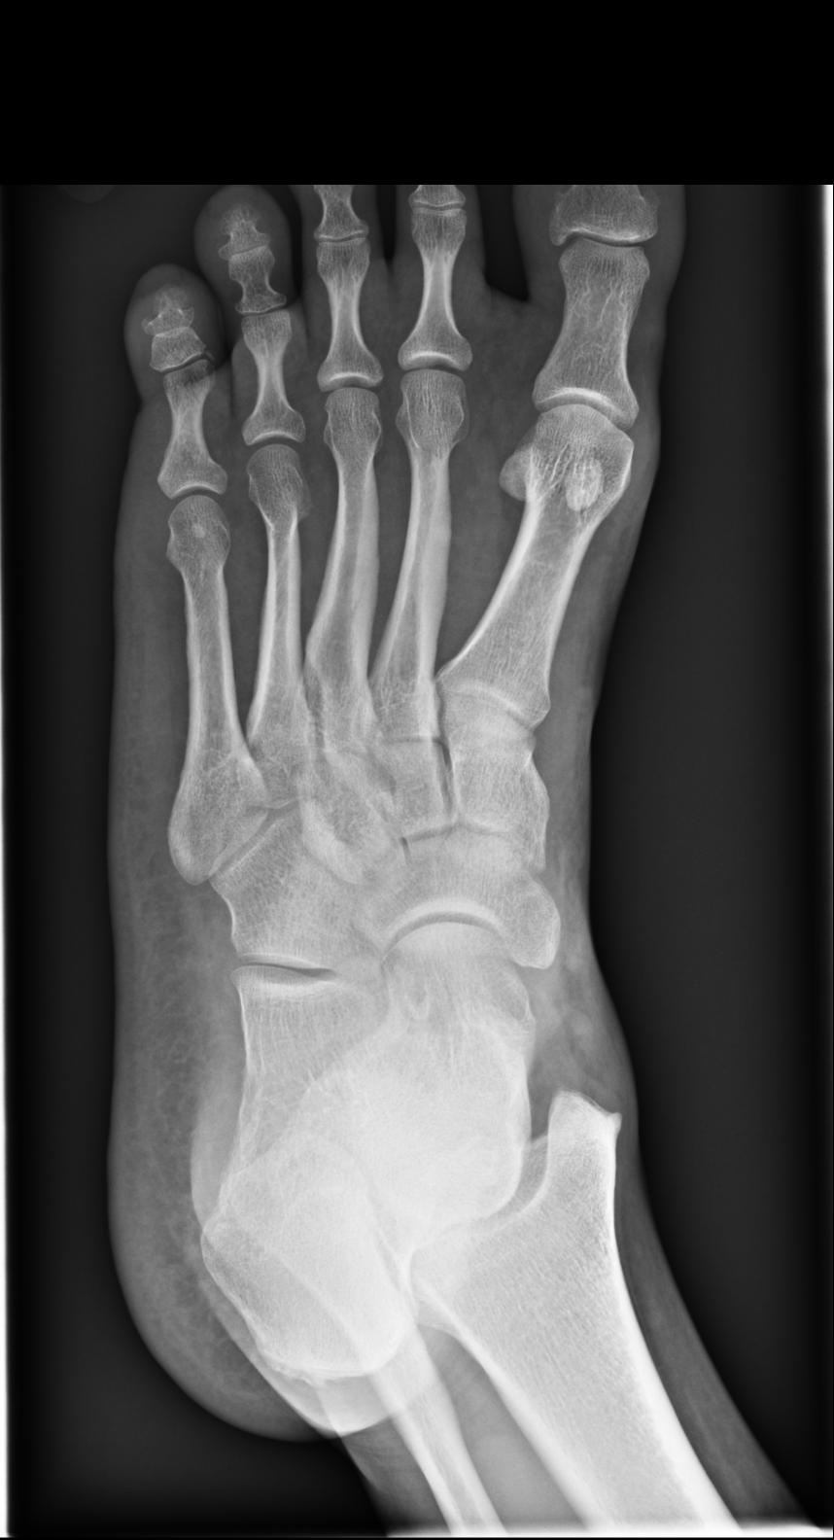
[im 3/3]
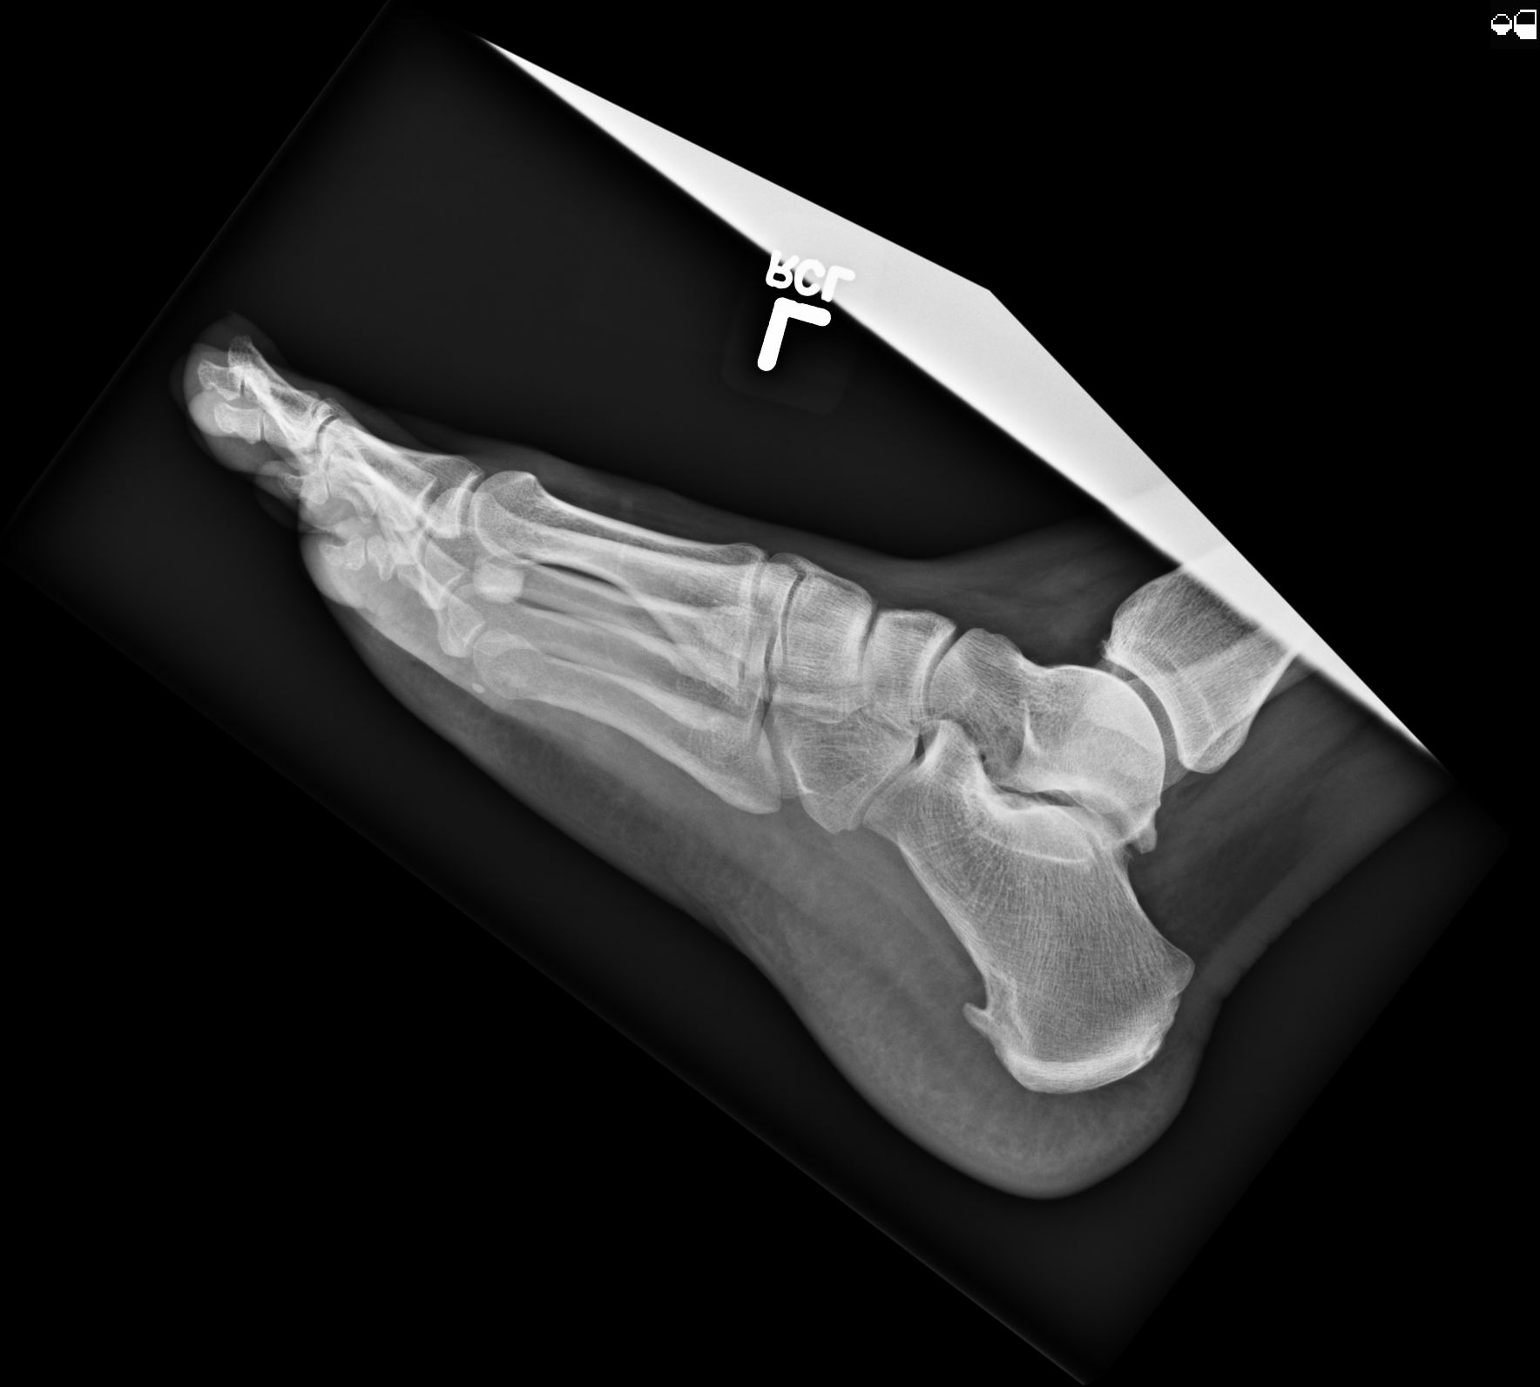

[3 of 3 positions shown; findings below may reference images not displayed]

FINDINGS: There is no evidence of fracture or dislocation. There is a small
plantar calcaneal spur. There is no evidence of arthropathy or other
focal bone abnormality. Soft tissues are unremarkable.
IMPRESSION: Negative.

## 2014-10-07 IMAGING — US US OB US >=[ID] SNGL FETUS
1 series · 13 of 28 positions shown · non-contrast
Comparison: none

CLINICAL DATA: Pregnancy.

EXAM:
ULTRASOUND OB >=CJXQH SINGLE FETUS

[Series 1: us ob us >=(id) sngl fetus · 0.21mm/px · 13 of 93 slices shown]
[im 4/93]
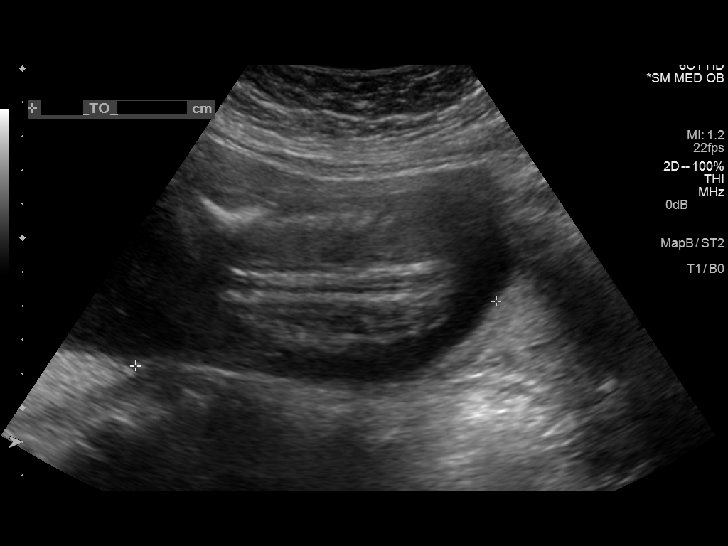
[im 11/93]
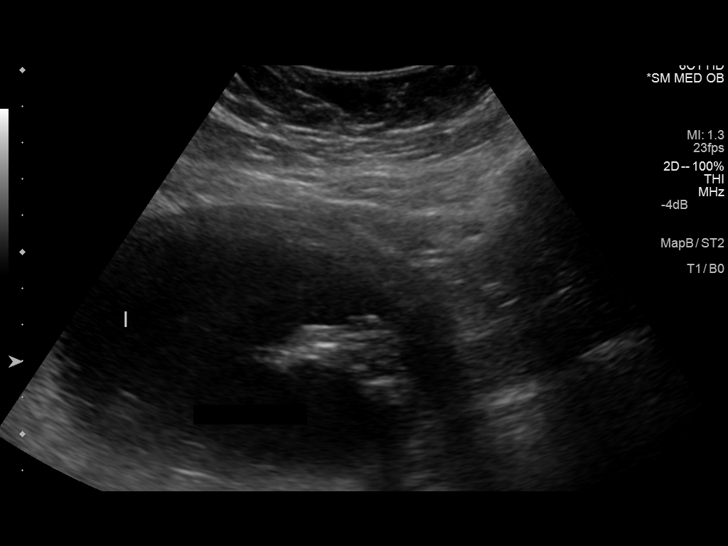
[im 18/93]
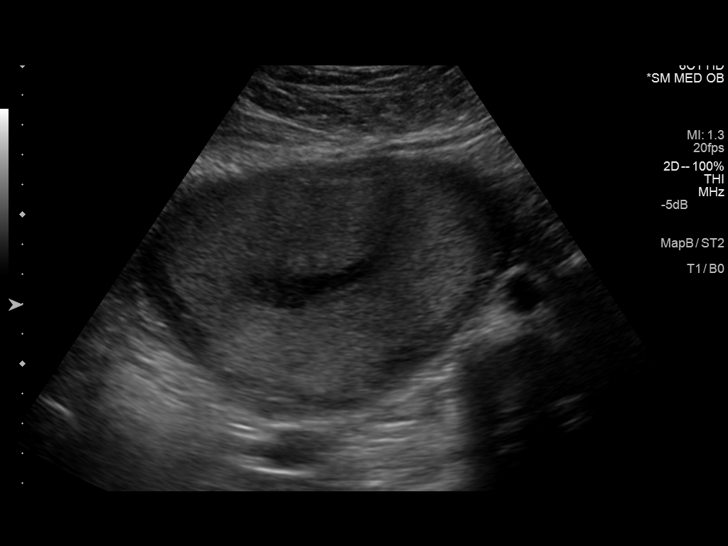
[im 24/93]
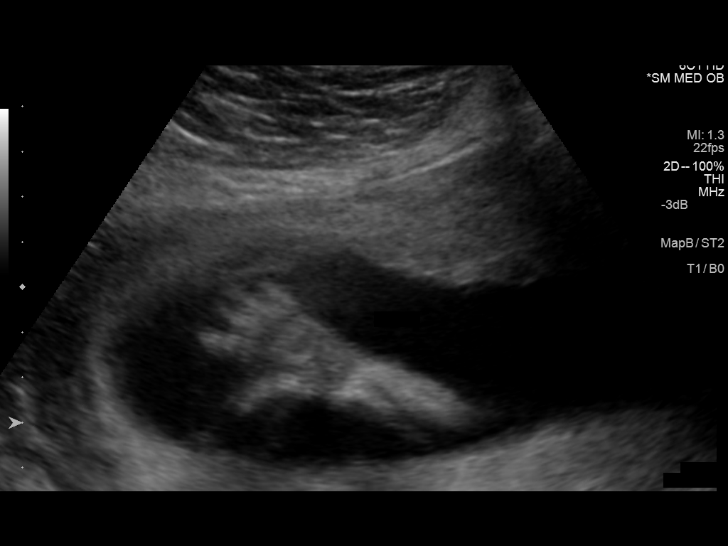
[im 31/93]
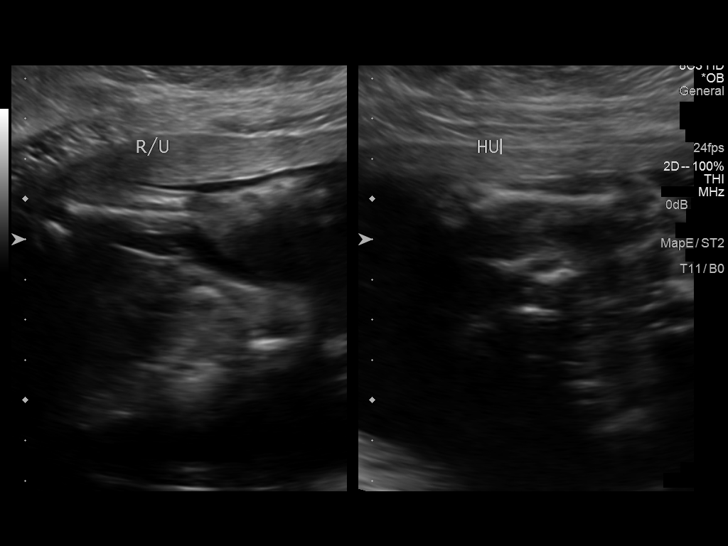
[im 38/93]
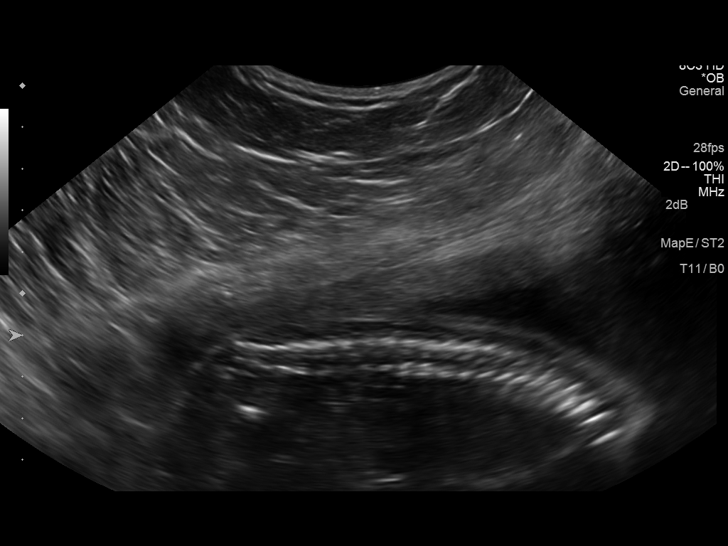
[im 48/93]
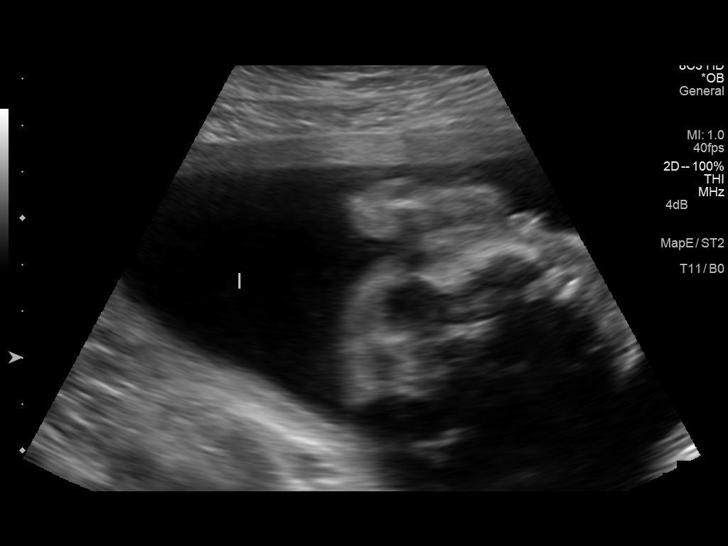
[im 55/93]
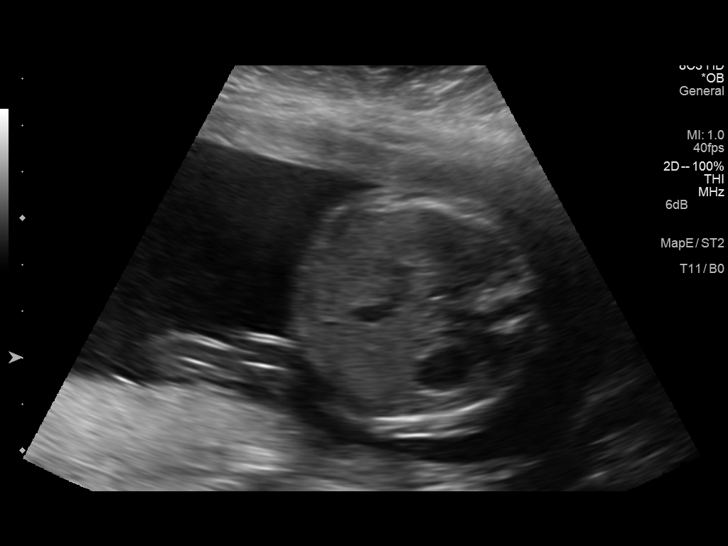
[im 62/93]
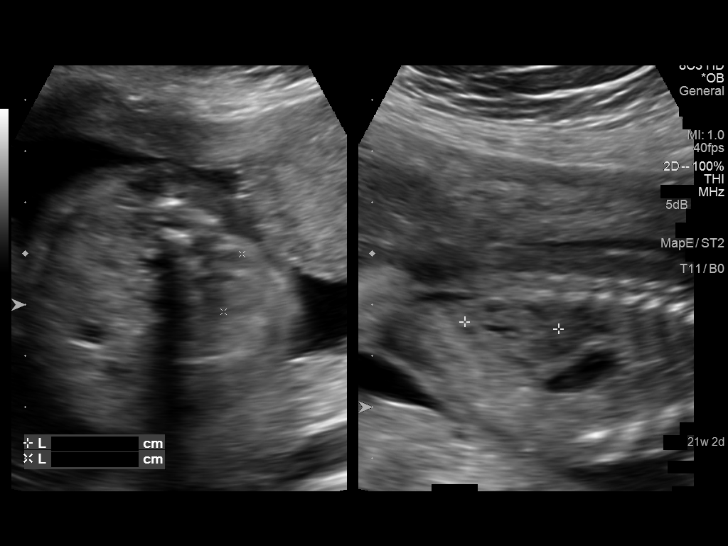
[im 69/93]
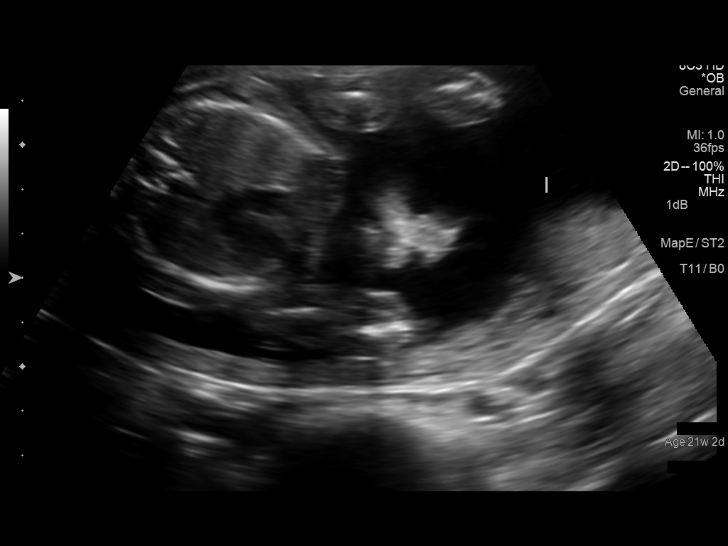
[im 75/93]
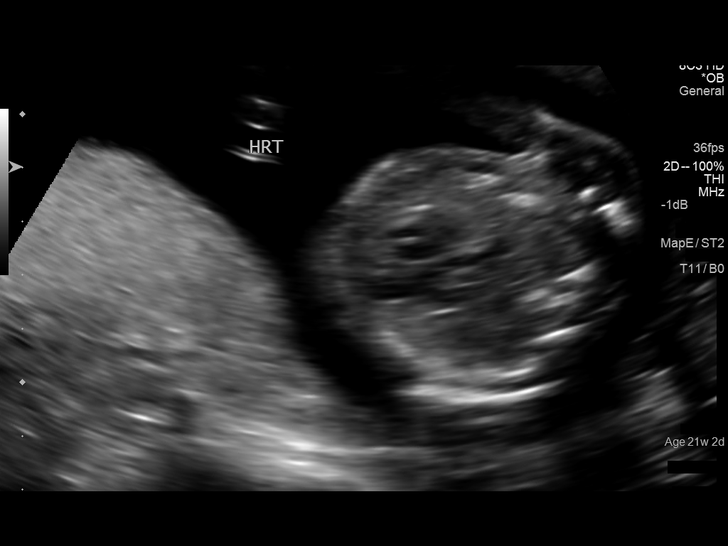
[im 82/93]
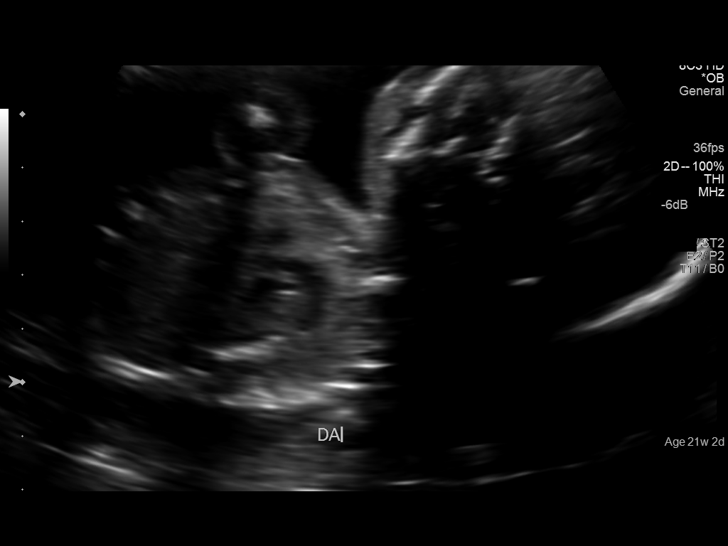
[im 89/93]
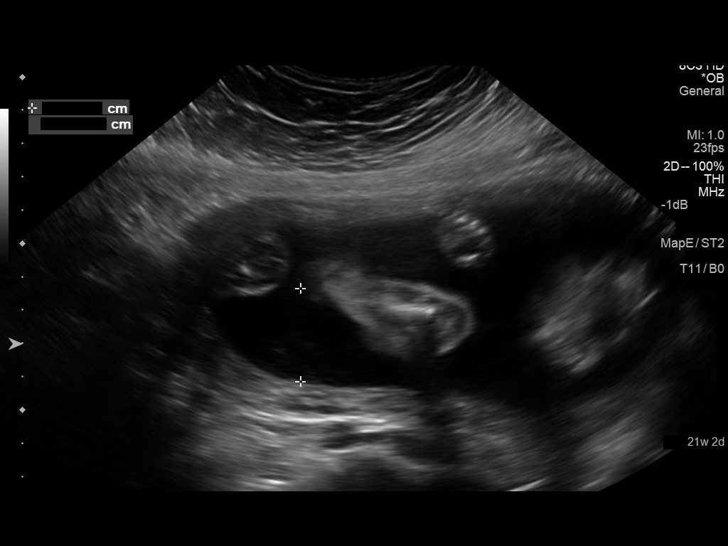

[13 of 28 positions shown; findings below may reference images not displayed]

FINDINGS: Number of Fetuses: 1

Heart Rate:  144 bpm

Movement: Present.

Presentation: Variable.

Previa: No.

Placental Location: Posterior right lateral.

Amniotic Fluid (Subjective): Normal.

Amniotic Fluid (Objective):

Vertical Pocket 5.3 cm    AFI 17.8 cm.  This is normal for 22 weeks.

FETAL BIOMETRY

BPD:  5.4 cmcm 22w 3d

HC:    20.0cm  22w   1d

AC:   17.3cm  22w   2d

FL:   8.6cm  21w   4d

Current Mean GA: 22w 2d

US EDC: 08/07/2014.

Assigned GA:  21w 1d

Assigned EDC:  08/14/2014.

Estimated Fetal Weight:  575grams.  79th Percentile

FETAL ANATOMY

Evaluation of fetal anatomy should be performed at a tertiary care
center or high risk OB Center, particularly if the patient has any
risk factors for this pregnancy.

Lateral Ventricles: Visualized.

Thalami/CSP: Visualized.

Posterior Fossa:  Visualized.

Nuchal Region: Visualized.    Spine: Visualized.

Upper Lip: Visualized.

4 Chamber Heart on Left: Visualized.

Stomach on Left: Visualized.

3 Vessel Cord: Visualized.

Cord Insertion site: Visualized.

Kidneys: Visualized.

Bladder: Visualized.

Extremities: Visualized.

Maternal Findings:

Cervix:  4.0   cm  closed.
IMPRESSION: Single viable intrauterine pregnancy at 22 weeks 2 days.
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
## 2014-12-24 NOTE — Discharge Summary (Signed)
PATIENT NAME:  Ellen AmosSTUART, Mechille A MR#:  [id number] DATE OF BIRTH:  1976-07-21  DATE OF ADMISSION:  08/27/2011 DATE OF DISCHARGE:  09/01/2011  HISTORY OF PRESENT ILLNESS: Ms. Ellen Conway is a 39 year old female admitted with severe depression. She had been stating that she did not want to live anymore and had been experiencing over two weeks of depressed mood, poor energy, poor concentration, anhedonia, insomnia and poor appetite.   She was experiencing stresses of losing her job, financial stress, and being homeless. Please see the admission history and physical dictation.   ANCILLARY CLINICAL DATA: None.   HOSPITAL COURSE: Ms. Ellen Conway was admitted to the inpatient Behavioral Medicine Unit and underwent milieu and group psychotherapy.   She was started on venlafaxine and it was titrated to 75 mg every a.m. She tolerated the medication well without increasing her blood pressure. She also tolerated the addition of trazodone for anti-insomnia and augmenting venlafaxine.   As her hospital course progressed she became less depressed and less withdrawn. She engaged in group therapy for hopelessness and anhedonia resolved. She regained her hope and interest and her energy returned to normal.   CONDITION ON DISCHARGE: By the 31st of December 2012 Ms. Ellen Conway has normal energy as well as normal interest. Her hope is intact. She is not having any hallucinations or delusions. Her thought process is normal. She has no thoughts of harming herself or others. She is not having any adverse medication effects.   MENTAL STATUS EXAM UPON DISCHARGE: Ms. Ellen Conway is alert. Her eye contact is good. Her concentration is normal. Speech, use of language, and fund of knowledge are within normal limits. Her speech involves normal rate and prosody without dysarthria. Thought process is logical, coherent, and goal directed. No looseness of associations or tangents. Thought content: No thoughts of harming herself, no thoughts of harming  others, and no delusions. Affect is broad and appropriate. Mood is within normal limits. Insight is intact. Judgment is intact.   DISCHARGE DIAGNOSES:  AXIS I:  1. 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  2. Polysubstance dependence.   AXIS II: Deferred.   AXIS III: History of seizures with pseudotumor, history of hypothyroidism with a pending thyroid panel after an elevated TSH.   AXIS IV: Economic, primary support group.   AXIS V: 55.   Ms. Ellen Conway is not at risk to harm herself or others. She agrees to call emergency services immediately for any thoughts of harming herself, thoughts of harming others or distress. She agrees to not drive if drowsy.   DISCHARGE MEDICATIONS:  1. Trazodone 100 mg at bedtime. 2. Venlafaxine 75 mg every a.m.  3. Acetazolamide 500 mg b.i.d.  4. Topamax 200 mg b.i.d.  5. Trazodone 100 mg at bedtime.   ACTIVITY: Routine.   DIET: Regular.   FOLLOWUP APPOINTMENTS: She will follow-up with [facility] for her medication management within seven days of discharge. She will also [facility] with the results of her thyroid panel. She is not interested in a residential chemical dependence treatment program. She will utilize the twelve-step method and groups. ____________________________ Adelene AmasJames S. Vale Mousseau, MD jsw:[id number] D:  09/01/2011 21:57:00 ET  T:  09/03/2011 15:21:45 ET  JOB#: [id number] Lester CarolinaJAMES S Aerionna Moravek MD ELECTRONICALLY SIGNED 09/07/2011 21:45

## 2015-01-09 NOTE — H&P (Signed)
L&D Evaluation:  History:  HPI Pt is a 39 yo G4P3 at 10630w2d GA dated by a 12w u/s on 6[facility]1 [facility] who presents with nausea, vomiting, and diarrhea x 2 days. She reports that she has not had any prenatal care besides the ultrasound due to her work schedule and not being able to complete the [facility] to get an [facility]t. All of her children have been [facility] due to her past medical history of pseudotumor cerebri needing a shunt due her her needing neurosurgery during delivery. She reports that she has held some drink down this am but has not tried any food because she is afraid to throw it up. She reports +FM, denies vb, lof or ctx. She does reports some right sided pain from throwing up.   Presents with nausea/vomiting   Patient's Medical History other  pseudotumor cerebri   Patient's Surgical History Colecystectomy  other  VP shunt placement   Medications Pre Natal Vitamins   Allergies PCN, other, imitrex, percocet, tomatoes   Social History tobacco  5 cigarettes per day   Family History Non-Contributory   ROS:  ROS All systems were reviewed.  HEENT, CNS, GI, GU, Respiratory, CV, Renal and Musculoskeletal systems were found to be normal.   Exam:  Vital Signs stable   General no apparent distress   Mental Status clear   Chest clear   Heart normal sinus rhythm   Abdomen gravid, non-tender   Back no CVAT   Mebranes Intact   FHT other, 150- 160's   Ucx absent   Skin dry, no lesions, no rashes   Lymph no lymphadenopathy  other   Other UA negative. WBC normal   Impression:  Impression other, IUP at 7030w2d, r/o gastroenteritis, insufficient prenatal care   Plan:  Plan fluids, zofran for nausea, encourage fluids, prenatal labs, anatomy scan   Comments Pt states she can call [facility] to start prenatal care after August 17th.   Follow Up Appointment need to schedule. [facility]   Electronic Signatures: Jannet MantisSubudhi, Traylen Eckels (CNM)  (Signed  05-Aug-15 10:38)  Authored: L&D Evaluation   Last Updated: 05-Aug-15 10:38 by Jannet MantisSubudhi, Takima Encina (CNM)

## 2015-01-09 NOTE — H&P (Signed)
L&D Evaluation:  History:  HPI 39 yo G4P3003 with PNC at [facility] significant for falling today at 1900 over her dogs and hitting with her arms extended and missing her abd on the concrete. Here for monitoring due to fall. After 1 dose of Terb, no further UC's seen, No lOF, No VB or decreased FM. Pt has a significant hx due to pseudotumor cerebri and Lt shunt (VP). NO RECORDS AVAILABLE   Presents with other, Fall   Patient's Medical History Shunt for pseudotumor cerebri   Patient's Surgical History none   Medications Pre Natal Vitamins   Allergies NKDA   Social History none   Family History Non-Contributory   ROS:  ROS All systems were reviewed.  HEENT, CNS, GI, GU, Respiratory, CV, Renal and Musculoskeletal systems were found to be normal.   Exam:  Vital Signs stable   General no apparent distress   Mental Status clear   Chest clear   Heart Pulse reg   Abdomen Gravid,   Estimated Fetal Weight Average for gestational age   Back no CVAT   Edema 1+   Pelvic not eval   Mebranes Intact   FHT normal rate with no decels   Ucx regular   Skin dry   Lymph no lymphadenopathy   Impression:  Impression Fall   Plan:  Plan Eval for fall   Comments FU next week for ob care. Pt to return if UC's, Vb, decreased FM or any other  concerns. 1 dose of Terb and pt has stopped contracting.   Electronic Signatures: Sharee PimpleJones, Laquan Ludden W (CNM)  (Signed [phone]-Nov-15 23:38)  Authored: L&D Evaluation   Last Updated: 05-Nov-15 23:38 by Sharee PimpleJones, Tieler Cournoyer W (CNM)

## 2015-04-13 ENCOUNTER — Emergency Department: Payer: Medicaid Other

## 2015-04-13 ENCOUNTER — Emergency Department
Admission: EM | Admit: 2015-04-13 | Discharge: 2015-04-13 | Disposition: A | Discharge status: Home / Self Care | Payer: Medicaid Other | Attending: Emergency Medicine | Admitting: Emergency Medicine

## 2015-04-13 ENCOUNTER — Emergency Department: Payer: Self-pay

## 2015-04-13 DIAGNOSIS — Y9389 Activity, other specified: Secondary | ICD-10-CM | POA: Insufficient documentation

## 2015-04-13 DIAGNOSIS — S46002A Unspecified injury of muscle(s) and tendon(s) of the rotator cuff of left shoulder, initial encounter: Secondary | ICD-10-CM

## 2015-04-13 DIAGNOSIS — Y998 Other external cause status: Secondary | ICD-10-CM | POA: Insufficient documentation

## 2015-04-13 DIAGNOSIS — Z720 Tobacco use: Secondary | ICD-10-CM | POA: Insufficient documentation

## 2015-04-13 DIAGNOSIS — Z880 Allergy status to penicillin: Secondary | ICD-10-CM | POA: Insufficient documentation

## 2015-04-13 DIAGNOSIS — S4992XA Unspecified injury of left shoulder and upper arm, initial encounter: Secondary | ICD-10-CM | POA: Insufficient documentation

## 2015-04-13 DIAGNOSIS — M7918 Myalgia, other site: Secondary | ICD-10-CM

## 2015-04-13 DIAGNOSIS — Y9289 Other specified places as the place of occurrence of the external cause: Secondary | ICD-10-CM | POA: Insufficient documentation

## 2015-04-13 DIAGNOSIS — W1839XA Other fall on same level, initial encounter: Secondary | ICD-10-CM | POA: Insufficient documentation

## 2015-04-13 HISTORY — DX: Anxiety disorder, unspecified: F41.9

## 2015-04-13 HISTORY — DX: Benign intracranial hypertension: G93.2

## 2015-04-13 HISTORY — DX: Disorder of thyroid, unspecified: E07.9

## 2015-04-13 MED ORDER — MELOXICAM 15 MG PO TABS: 15.0000 mg | ORAL_TABLET | Freq: Every day | ORAL | Status: AC

## 2015-04-13 MED ORDER — TRAMADOL HCL 50 MG PO TABS: 50.0000 mg | ORAL_TABLET | Freq: Four times a day (QID) | ORAL | Status: AC | PRN

## 2015-04-13 NOTE — Discharge Instructions (Signed)
Follow up with orthopedics. No lifting over 10 pounds until you are cleared by orthopedics. Wear the sling when up and moving around. Remove it when sitting/at night.

## 2015-04-13 NOTE — ED Notes (Signed)
Pt states she fell on some steps on Monday and has had left shoulder pain since

## 2015-04-13 NOTE — ED Provider Notes (Signed)
[facility] Emergency Department Provider Note ____________________________________________  Time seen: Approximately 10:22 AM  I have reviewed the triage vital signs and the nursing notes.   HISTORY  Chief Complaint Fall and Shoulder Pain  HPI Ellen Conway is a 39 y.o. female who presents to the emergency department for evaluation of left shoulder pain. She states that she fell down approximately 17 stairs on Monday. She was sore and tender all over but the left shoulder continues to be painful. She states that she feels a popping sensation when she attempts to lift the arm and feels burning with some movements.   Past Medical History  Diagnosis Date  . Pseudotumor cerebri   . Anxiety   . Thyroid disease     There are no active problems to display for this patient.   Past Surgical History  Procedure Laterality Date  . Ventricular atrial shunt    . Cholecystectomy    . Wisdom tooth extraction      No current outpatient prescriptions on file.  Allergies Bee venom; Imitrex; Tomato; and Penicillins  No family history on file.  Social History Social History  Substance Use Topics  . Smoking status: Current Every Day Smoker -- 0.50 packs/day    Types: Cigarettes  . Smokeless tobacco: Never Used  . Alcohol Use: No    Review of Systems Constitutional: No recent illness. Eyes: No visual changes. ENT: No sore throat. Cardiovascular: Denies chest pain or palpitations. Respiratory: Denies shortness of breath. Gastrointestinal: No abdominal pain.  Genitourinary: Negative for dysuria. Musculoskeletal: Pain in left shoulder Skin: Negative for rash. Neurological: Negative for headaches, focal weakness or numbness. 10-point ROS otherwise negative.  ____________________________________________   PHYSICAL EXAM:  VITAL SIGNS: ED Triage Vitals  Enc Vitals Group     BP 04/13/15 0949 118/87 mmHg     Pulse Rate 04/13/15 0949 90     Resp  04/13/15 0949 18     Temp 04/13/15 0949 98.2 F (36.8 C)     Temp Source 04/13/15 0949 Oral     SpO2 04/13/15 0949 97 %     Weight 04/13/15 0949 220 lb (99.791 kg)     Height 04/13/15 0949  (1.626 m)     Head Cir --      Peak Flow --      Pain Score 04/13/15 0949 6     Pain Loc --      Pain Edu? --      Excl. in [place]? --     Constitutional: Alert and oriented. Well appearing and in no acute distress. Eyes: Conjunctivae are normal. EOMI. Head: Atraumatic. Nose: No congestion/rhinnorhea. Neck: No stridor.  Respiratory: Normal respiratory effort.   Musculoskeletal: Left scapular tenderness Neurologic:  Normal speech and language. No gross focal neurologic deficits are appreciated. Speech is normal. No gait instability. Skin:  Skin is warm, dry and intact. Atraumatic. Psychiatric: Mood and affect are normal. Speech and behavior are normal.  ____________________________________________   LABS (all labs ordered are listed, but only abnormal results are displayed)  Labs Reviewed - No data to display ____________________________________________  RADIOLOGY  Left shoulder and scapula negative for bony abnormality. ____________________________________________   PROCEDURES  Procedure(s) performed: Sling applied to left shoulder by RN.   ____________________________________________   INITIAL IMPRESSION / ASSESSMENT AND PLAN / ED COURSE  Pertinent labs & imaging results that were available during my care of the patient were reviewed by me and considered in my medical decision making (see chart  for details).  Patient was advised follow-up with orthopedics. She was given specific instructions to remove the sling while at rest and at night. She is advised to return to the emergency department for symptoms that change or worsen if she is unable schedule an appointment. ____________________________________________   FINAL CLINICAL IMPRESSION(S) / ED DIAGNOSES  Final  diagnoses:  None      Chinita Pester, FNP 04/13/15 1149  Jene Every, MD 04/13/15 1309

## 2015-10-15 IMAGING — CR DG SHOULDER 2+V*L*
3 series · 3 of 3 positions shown · non-contrast
Comparison: None.

CLINICAL DATA: Fell down stairs and injured left shoulder this
morning.

EXAM:
LEFT SHOULDER - 2+ VIEW; LEFT SCAPULA - 2+ VIEWS

[shoulder grashey]
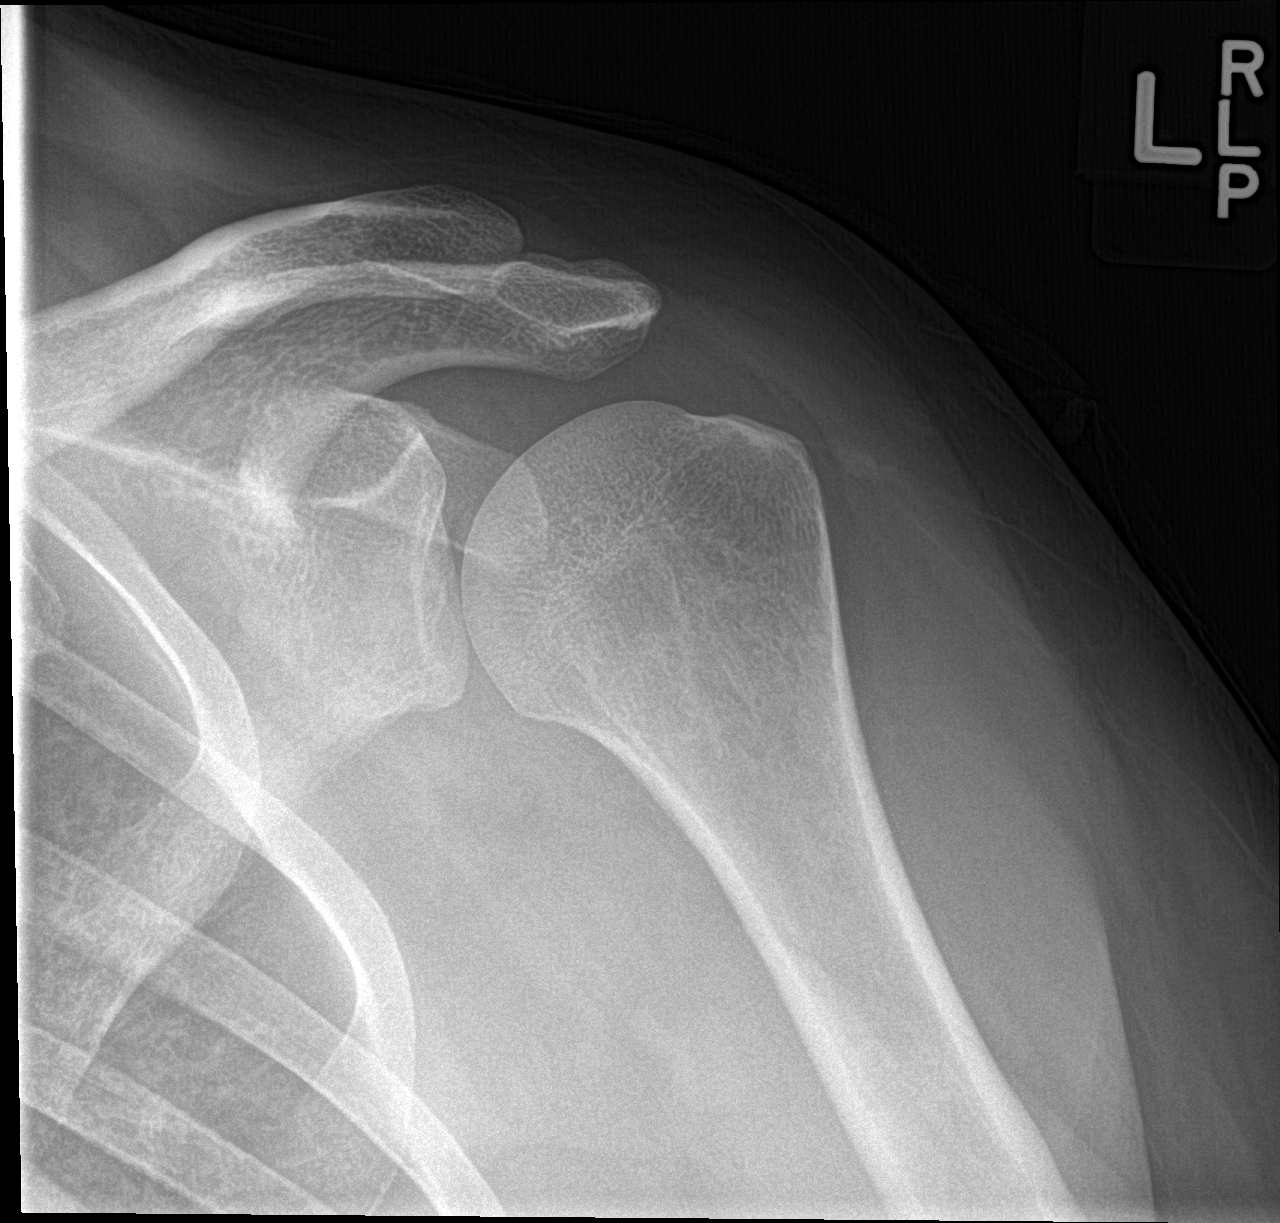

[shoulder y view]
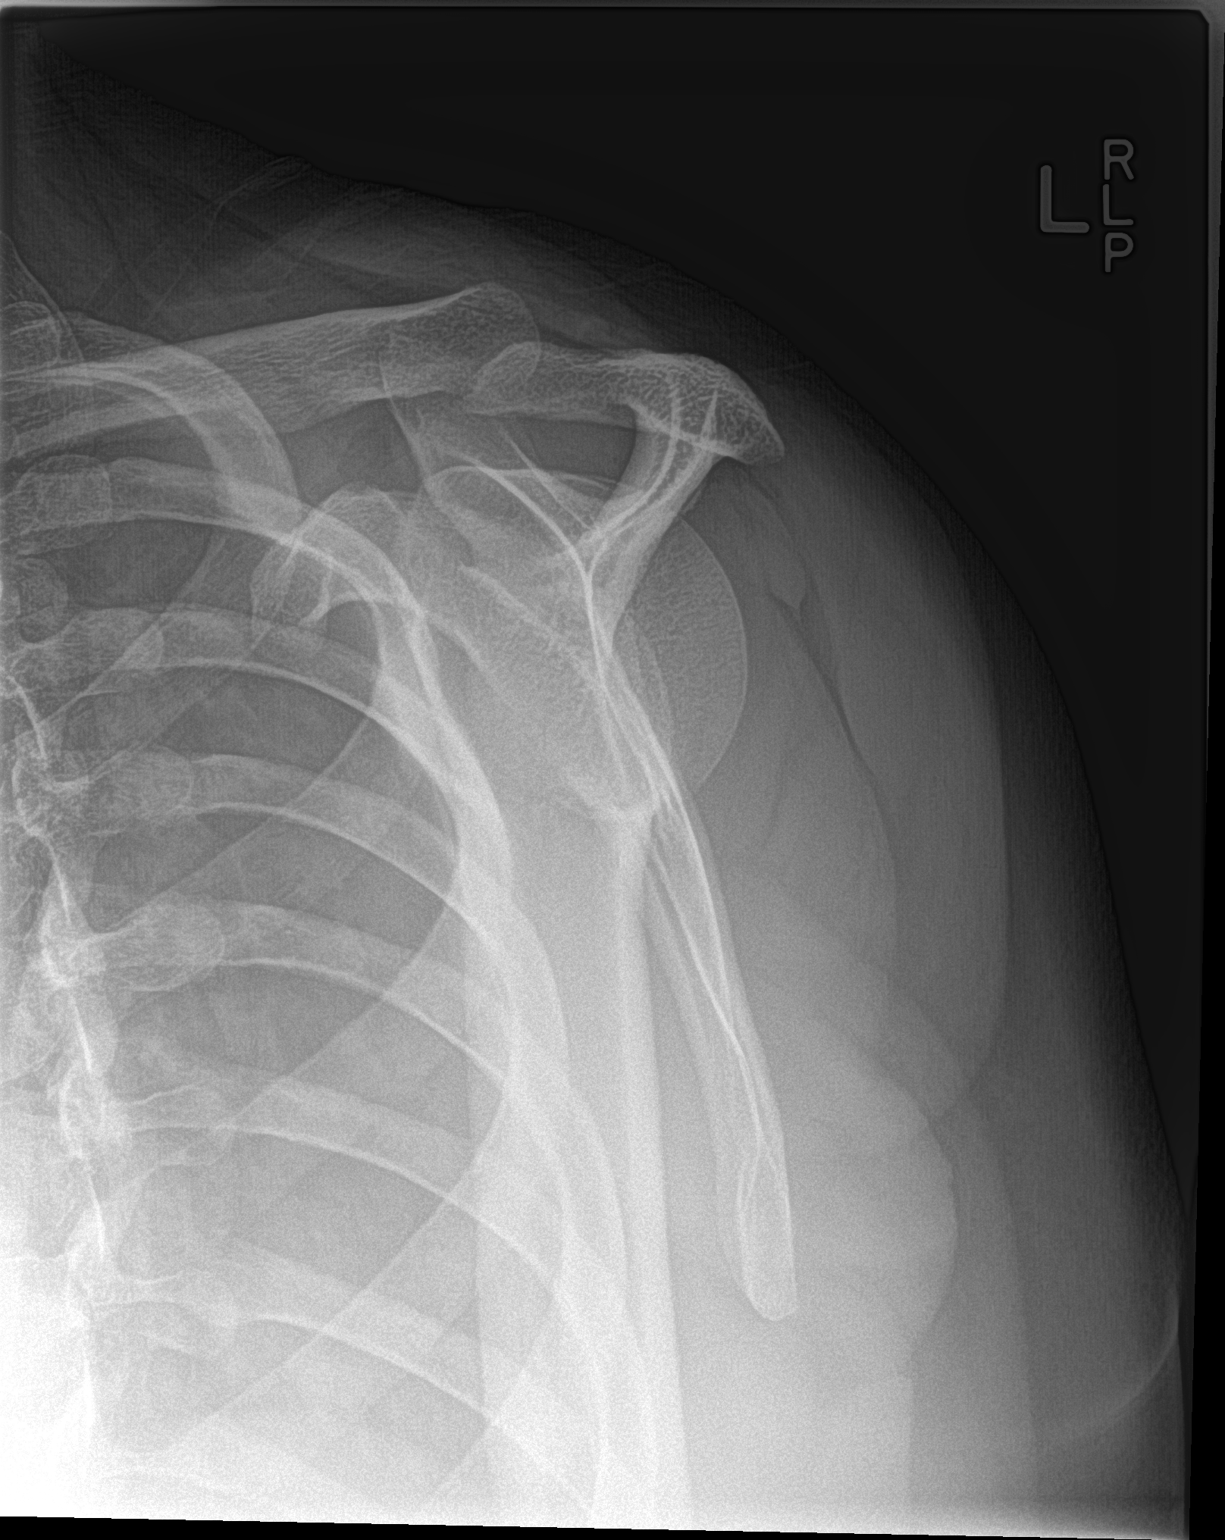

[shoulder axillary]
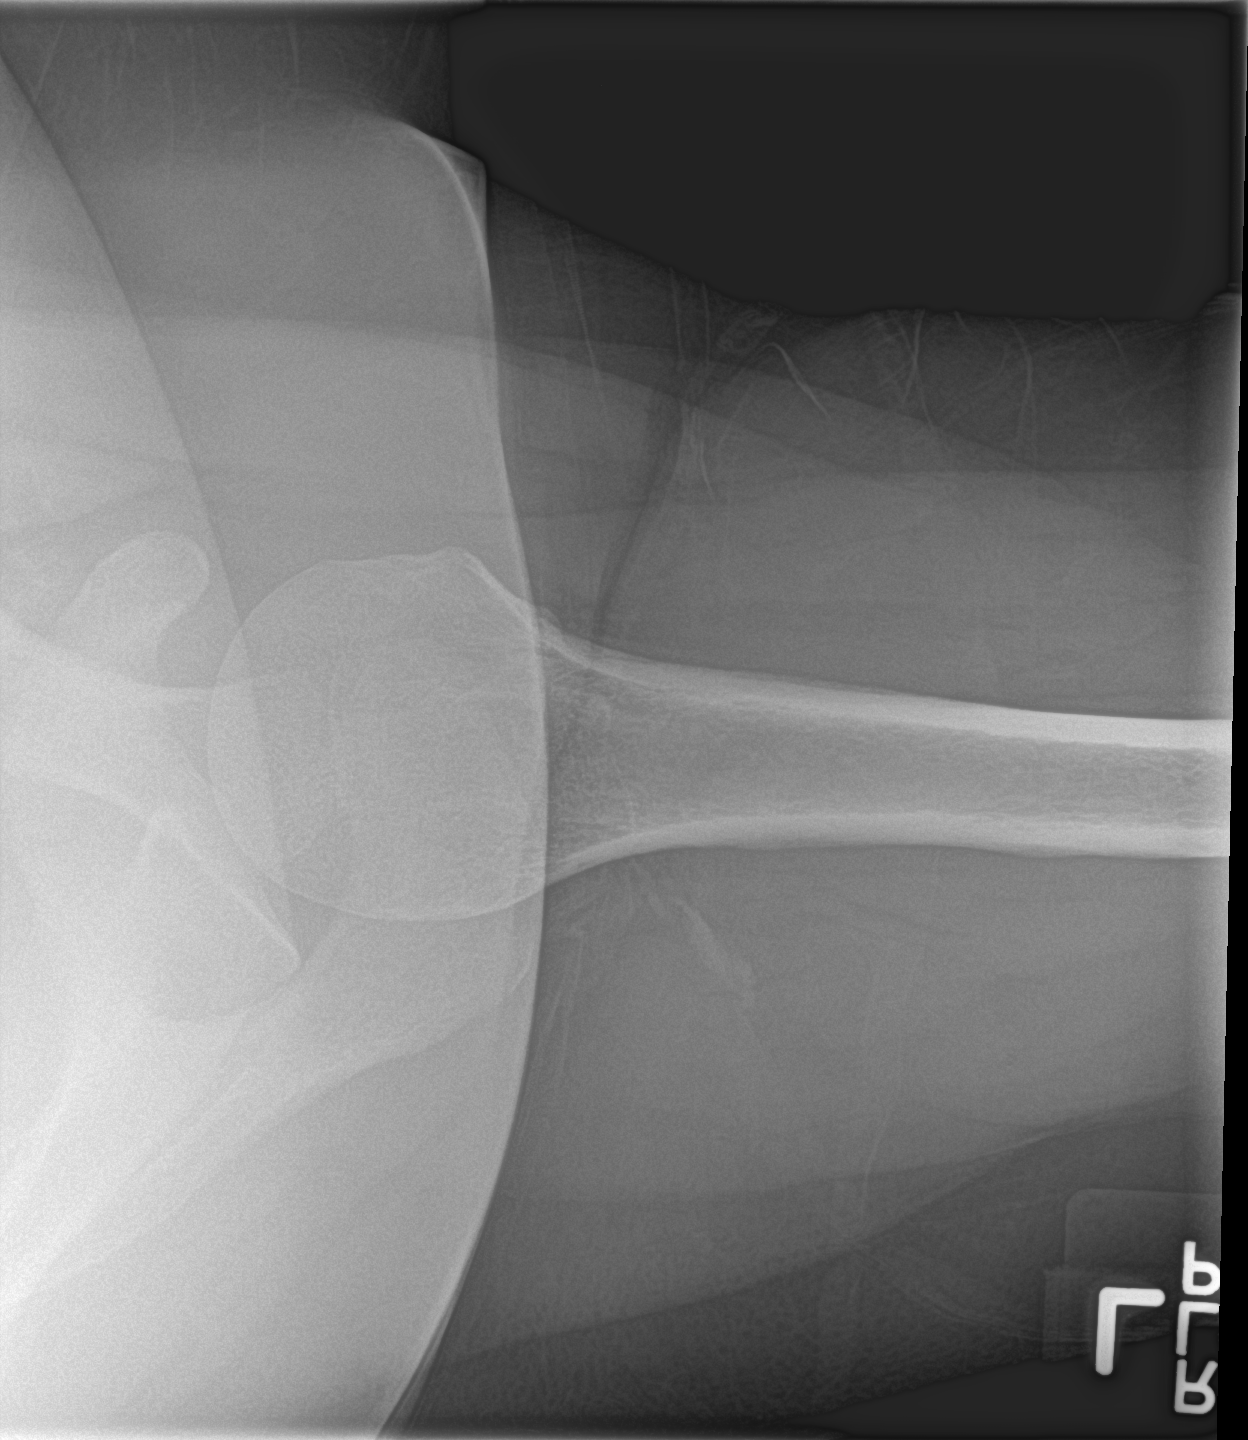

[3 of 3 positions shown; findings below may reference images not displayed]

FINDINGS: The AC joint and glenohumeral joints are maintained. No acute
fractures identified. The visualized left ribs are intact and the
visualized left lung is clear.
IMPRESSION: No acute bony findings.

## 2015-10-15 IMAGING — CR DG SCAPULA*L*
2 series · 2 of 2 positions shown · non-contrast
Comparison: None.

CLINICAL DATA: Fell down stairs and injured left shoulder this
morning.

EXAM:
LEFT SHOULDER - 2+ VIEW; LEFT SCAPULA - 2+ VIEWS

[scapula ap]
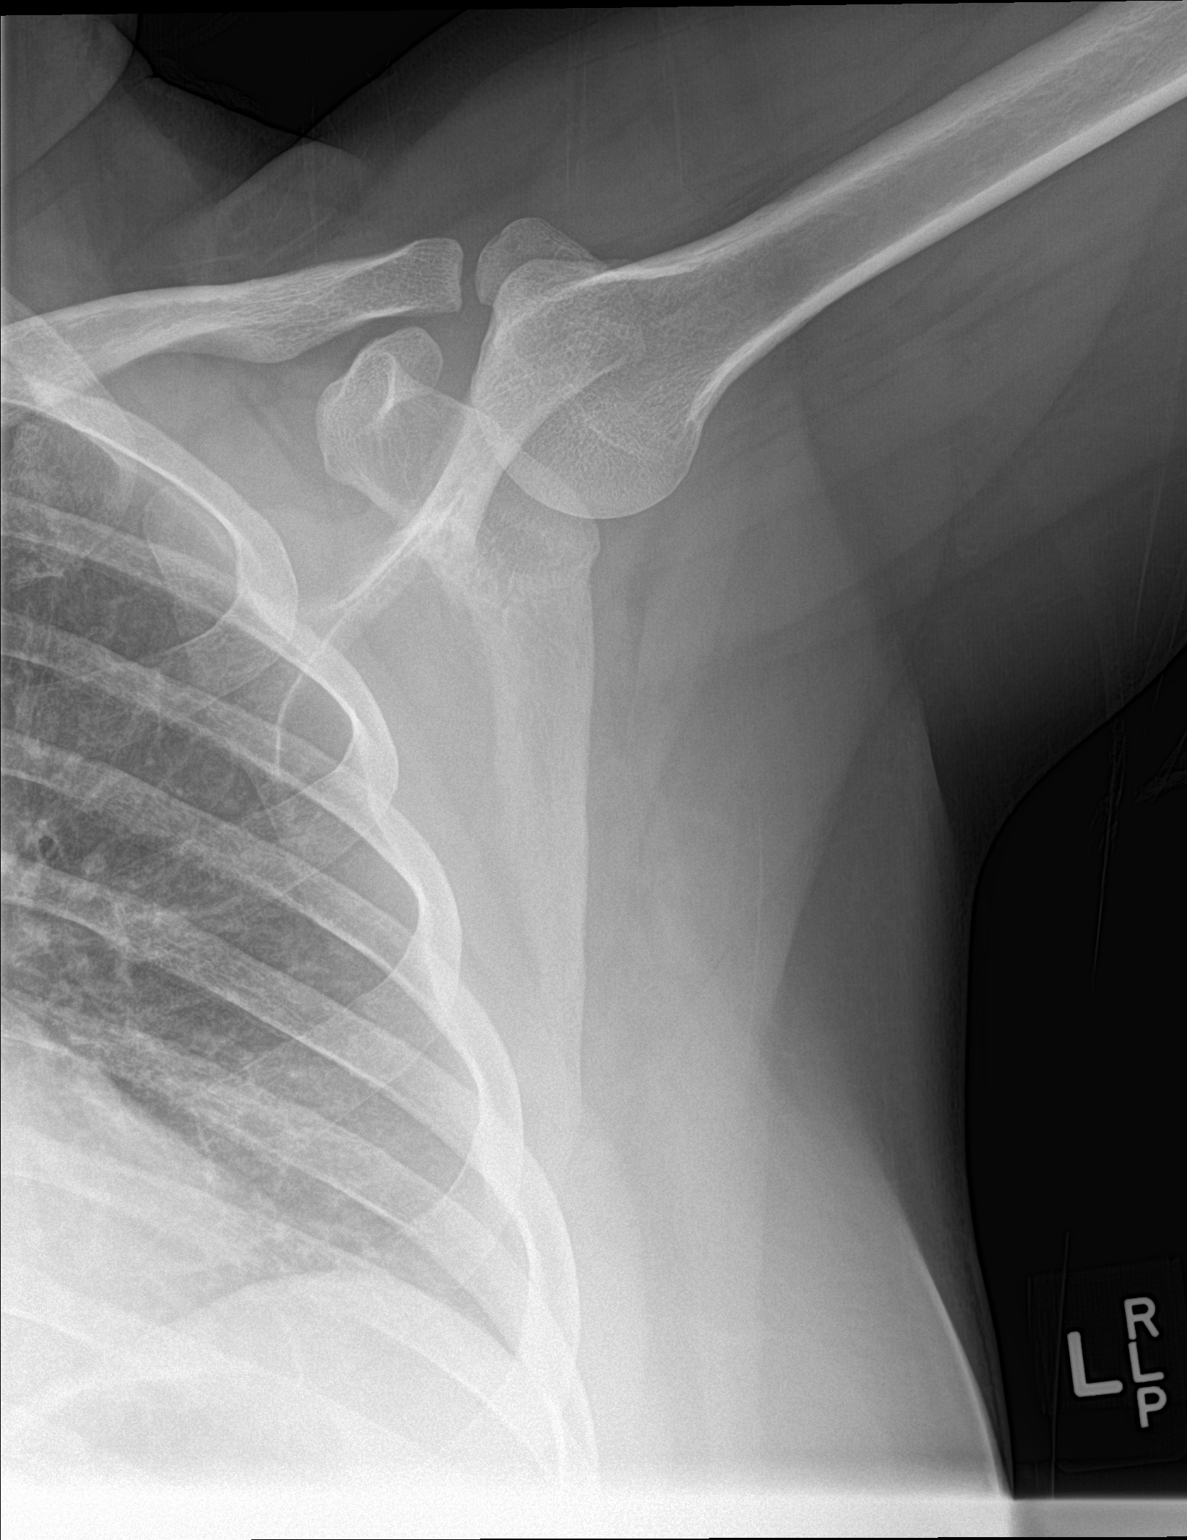

[shoulder y view]
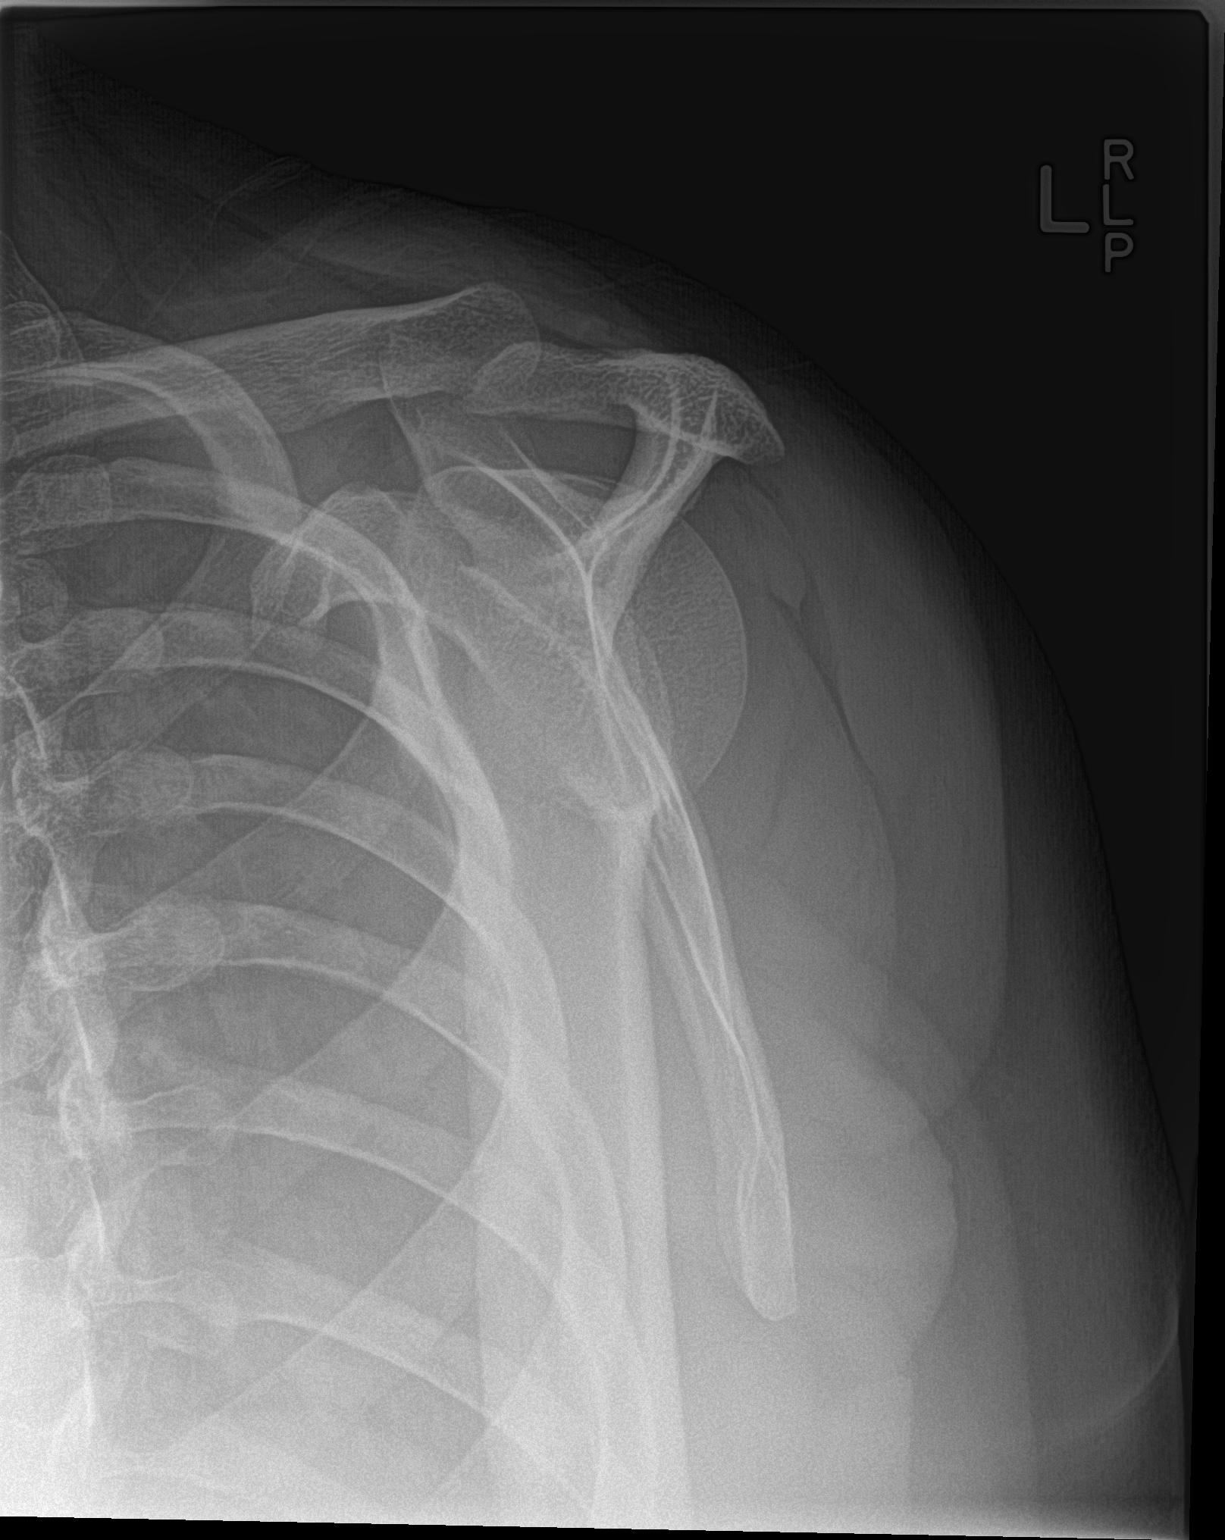

[2 of 2 positions shown; findings below may reference images not displayed]

FINDINGS: The AC joint and glenohumeral joints are maintained. No acute
fractures identified. The visualized left ribs are intact and the
visualized left lung is clear.
IMPRESSION: No acute bony findings.

## 2022-05-17 ENCOUNTER — Other Ambulatory Visit: Payer: Self-pay

## 2022-05-17 ENCOUNTER — Emergency Department: Payer: Self-pay

## 2022-05-17 ENCOUNTER — Emergency Department
Admission: EM | Admit: 2022-05-17 | Discharge: 2022-05-17 | Disposition: A | Discharge status: Home / Self Care | Payer: Self-pay | Attending: Emergency Medicine | Admitting: Emergency Medicine

## 2022-05-17 DIAGNOSIS — R531 Weakness: Secondary | ICD-10-CM | POA: Insufficient documentation

## 2022-05-17 DIAGNOSIS — R5381 Other malaise: Secondary | ICD-10-CM | POA: Insufficient documentation

## 2022-05-17 DIAGNOSIS — R519 Headache, unspecified: Secondary | ICD-10-CM | POA: Insufficient documentation

## 2022-05-17 DIAGNOSIS — Z982 Presence of cerebrospinal fluid drainage device: Secondary | ICD-10-CM | POA: Insufficient documentation

## 2022-05-17 DIAGNOSIS — Z20822 Contact with and (suspected) exposure to covid-19: Secondary | ICD-10-CM | POA: Insufficient documentation

## 2022-05-17 LAB — BASIC METABOLIC PANEL
Anion gap: 8 (ref 5–15)
BUN: 11 mg/dL (ref 6–20)
CO2: 23 mmol/L (ref 22–32)
Calcium: 8.9 mg/dL (ref 8.9–10.3)
Chloride: 108 mmol/L (ref 98–111)
Creatinine, Ser: 1.18 mg/dL — ABNORMAL HIGH (ref 0.44–1.00)
GFR, Estimated: 58 mL/min — ABNORMAL LOW (ref >60)
Glucose, Bld: 108 mg/dL — ABNORMAL HIGH (ref 70–99)
Potassium: 3.7 mmol/L (ref 3.5–5.1)
Sodium: 139 mmol/L (ref 135–145)

## 2022-05-17 LAB — CBC
HCT: 42.4 % (ref 36.0–46.0)
Hemoglobin: 14.1 g/dL (ref 12.0–15.0)
MCH: 31.8 pg (ref 26.0–34.0)
MCHC: 33.3 g/dL (ref 30.0–36.0)
MCV: 95.7 fL (ref 80.0–100.0)
Platelets: 247 10*3/uL (ref 150–400)
RBC: 4.43 MIL/uL (ref 3.87–5.11)
RDW: 12.1 % (ref 11.5–15.5)
WBC: 11 10*3/uL — ABNORMAL HIGH (ref 4.0–10.5)
nRBC: 0 % (ref 0.0–0.2)

## 2022-05-17 LAB — URINALYSIS, ROUTINE W REFLEX MICROSCOPIC
Bilirubin Urine: NEGATIVE
Glucose, UA: NEGATIVE mg/dL
Ketones, ur: NEGATIVE mg/dL
Leukocytes,Ua: NEGATIVE
Nitrite: NEGATIVE
Protein, ur: NEGATIVE mg/dL
Specific Gravity, Urine: 1.005 (ref 1.005–1.030)
pH: 6 (ref 5.0–8.0)

## 2022-05-17 LAB — RESP PANEL BY RT-PCR (FLU A&B, COVID) ARPGX2
Influenza A by PCR: NEGATIVE
Influenza B by PCR: NEGATIVE
SARS Coronavirus 2 by RT PCR: NEGATIVE

## 2022-05-17 LAB — TROPONIN I (HIGH SENSITIVITY): Troponin I (High Sensitivity): <2 ng/L (ref <18)

## 2022-05-17 LAB — POC URINE PREG, ED: Preg Test, Ur: NEGATIVE

## 2022-05-17 MED ORDER — SODIUM CHLORIDE 0.9 % IV BOLUS: 1000.0000 mL | Freq: Once | INTRAVENOUS | Status: DC

## 2022-05-17 MED ORDER — KETOROLAC TROMETHAMINE 15 MG/ML IJ SOLN: 15.0000 mg | Freq: Once | INTRAMUSCULAR | Status: DC

## 2022-05-17 NOTE — ED Notes (Signed)
POC urine preg was negative.  

## 2022-05-17 NOTE — ED Notes (Signed)
Repeat troponin not needed per Jari Pigg, MD

## 2022-05-17 NOTE — ED Triage Notes (Signed)
Pt states that yesterday she was with her husband going into the store when she got weak and diaphoretic and nauseous- pt states today that she took her BP at [employer] and it was 147/94 but later she started to get a headache and a home machine said her BP was 160/110- pt has also been having bad heart burn and today her R jaw is hurting

## 2022-05-17 NOTE — ED Provider Notes (Signed)
[facility] Provider Note    Event Date/Time   First MD Initiated Contact with Patient 05/17/22 1817     (approximate)   History   Weakness   HPI  Ellen Conway is a 46 y.o. female with a past medical history of pseudotumor cerebri with VP shunt who presents today for evaluation of weakness, generalized malaise.  Patient reports that this began yesterday but today she feels much better.  She is concerned today because she went to [facility] and checked her blood pressure noticed that it was 191 systolic.  She reports that yesterday she woke up feeling very tired and slept until noon which is unusual for her.  She reports that she has had intermittent headaches for several months, and has a headache again today than yesterday.  She denies any visual changes.  She reports that yesterday at around 4 PM she suddenly felt very sweaty and felt like she had gastric reflux.  She reports that this improved when she sat down.  Today she feels better but not standpoint but is concerned about her blood pressure.  She denies chest pain, shortness of breath, abdominal pain, nausea, vomiting, diarrhea.  She has not had any fevers or chills.  She reports that she has nasal congestion from her "seasonal allergies" but denies cough.  There are no problems to display for this patient.         Physical Exam   Triage Vital Signs: ED Triage Vitals  Enc Vitals Group     BP 05/17/22 1707 (!) 133/93     Pulse Rate 05/17/22 1707 86     Resp 05/17/22 1707 18     Temp 05/17/22 1710 98.1 F (36.7 C)     Temp Source 05/17/22 1710 Oral     SpO2 05/17/22 1707 99 %     Weight 05/17/22 1707 220 lb (99.8 kg)     Height 05/17/22 1707 5\' 4"  (1.626 m)     Head Circumference --      Peak Flow --      Pain Score 05/17/22 1707 6     Pain Loc --      Pain Edu? --      Excl. in [place]? --     Most recent vital signs: Vitals:   05/17/22 1710 05/17/22 1831  BP:  112/88  Pulse:  72  Resp:   16  Temp: 98.1 F (36.7 C) 98.1 F (36.7 C)  SpO2:  97%    Physical Exam Vitals and nursing note reviewed.  Constitutional:      General: Awake and alert. No acute distress.    Appearance: Normal appearance. The patient is normal weight.  HENT:     Head: Normocephalic and atraumatic.     Mouth: Mucous membranes are moist.  Eyes:     General: PERRL. Normal EOMs        Right eye: No discharge.        Left eye: No discharge.     Conjunctiva/sclera: Conjunctivae normal.  Cardiovascular:     Rate and Rhythm: Normal rate and regular rhythm.     Pulses: Normal pulses.     Heart sounds: Normal heart sounds Pulmonary:     Effort: Pulmonary effort is normal. No respiratory distress.     Breath sounds: Normal breath sounds.  Abdominal:     Abdomen is soft. There is no abdominal tenderness. No rebound or guarding. No distention. Musculoskeletal:  General: No swelling. Normal range of motion.     Cervical back: Normal range of motion and neck supple.  Skin:    General: Skin is warm and dry.     Capillary Refill: Capillary refill takes less than 2 seconds.     Findings: No rash.  Neurological:     Mental Status: The patient is awake and alert.  Neurological: GCS 15 alert and oriented x3 Normal speech, no expressive or receptive aphasia or dysarthria Cranial nerves II through XII intact Normal visual fields 5 out of 5 strength in all 4 extremities with intact sensation throughout No extremity drift Normal finger-to-nose testing, no limb or truncal ataxia     ED Results / Procedures / Treatments   Labs (all labs ordered are listed, but only abnormal results are displayed) Labs Reviewed  BASIC METABOLIC PANEL - Abnormal; Notable for the following components:      Result Value   Glucose, Bld 108 (*)    Creatinine, Ser 1.18 (*)    GFR, Estimated 58 (*)    All other components within normal limits  CBC - Abnormal; Notable for the following components:   WBC 11.0 (*)     All other components within normal limits  URINALYSIS, ROUTINE W REFLEX MICROSCOPIC - Abnormal; Notable for the following components:   Color, Urine STRAW (*)    APPearance CLEAR (*)    Hgb urine dipstick SMALL (*)    Bacteria, UA RARE (*)    All other components within normal limits  RESP PANEL BY RT-PCR (FLU A&B, COVID) ARPGX2  POC URINE PREG, ED  TROPONIN I (HIGH SENSITIVITY)  TROPONIN I (HIGH SENSITIVITY)     EKG     RADIOLOGY I independently reviewed and interpreted imaging and agree with radiologists findings.  Pending shunt series results at the time of pass off    PROCEDURES:  Critical Care performed:   Procedures   MEDICATIONS ORDERED IN ED: Medications  sodium chloride 0.9 % bolus 1,000 mL (has no administration in time range)  ketorolac (TORADOL) 15 MG/ML injection 15 mg (has no administration in time range)     IMPRESSION / MDM / ASSESSMENT AND PLAN / ED COURSE  I reviewed the triage vital signs and the nursing notes.   Differential diagnosis includes, but is not limited to, shunt malfunction, COVID, influenza, urinary tract infection, myocardial infarction, gastric reflux.  Patient is awake and alert, hemodynamically stable and afebrile.  She is nontoxic in appearance.  EKG obtained at triage is nonischemic.  First troponin is negative.  Her blood pressure which she was quite concerned about is normal here.  Labs obtained demonstrate a slight leukocytosis to 11.  Head was obtained which demonstrates decompression of her lateral ventricle by the VP shunt.  She was treated with Toradol and IV fluids for her headache.  Urinalysis demonstrates rare bacteria, though no leukocytes or nitrites.  Patient was passed off to Dr. Alfred Levins pending shunt series and COVID test and reevaluation.   Patient's presentation is most consistent with acute complicated illness / injury requiring diagnostic workup.   Clinical Course as of 05/17/22 1902  Sat May 17, 2022  1902  Passed off to Dr. Fuller Plan [JP]    Clinical Course User Index [JP] Evelia Waskey, Herb Grays, PA-C     FINAL CLINICAL IMPRESSION(S) / ED DIAGNOSES   Final diagnoses:  Acute nonintractable headache, unspecified headache type     Rx / DC Orders   ED Discharge Orders  None        Note:  This document was prepared using Dragon voice recognition software and may include unintentional dictation errors.   Keturah Shavers 05/17/22 1902    Concha Se, MD 05/17/22 2004

## 2022-05-17 NOTE — Discharge Instructions (Addendum)
Your work-up was reassuring there is no signs of a heart attack and your blood pressure was normal and your shunt seems to be working okay given your ventricle is decompressed on CT imaging.  SHe can follow-up with your primary care doctor and your neurosurgeon as planned and return to the ER if you develop worsening symptoms or any other concerns
